# Patient Record
Sex: Male | Born: 1985 | Race: White | Hispanic: No | Marital: Married | Smoking: Former smoker
Health system: Southern US, Community
[De-identification: ages and names within clinical notes are randomized; demographics above are authoritative.]

## PROBLEM LIST (undated history)

## (undated) DIAGNOSIS — H209 Unspecified iridocyclitis: Secondary | ICD-10-CM

## (undated) DIAGNOSIS — G40A09 Absence epileptic syndrome, not intractable, without status epilepticus: Secondary | ICD-10-CM

## (undated) DIAGNOSIS — K219 Gastro-esophageal reflux disease without esophagitis: Secondary | ICD-10-CM

## (undated) DIAGNOSIS — T7840XA Allergy, unspecified, initial encounter: Secondary | ICD-10-CM

## (undated) HISTORY — DX: Absence epileptic syndrome, not intractable, without status epilepticus: G40.A09

## (undated) HISTORY — DX: Unspecified iridocyclitis: H20.9

## (undated) HISTORY — DX: Gastro-esophageal reflux disease without esophagitis: K21.9

## (undated) HISTORY — DX: Allergy, unspecified, initial encounter: T78.40XA

---

## 2013-01-23 ENCOUNTER — Encounter: Payer: Self-pay | Admitting: Internal Medicine

## 2013-01-23 ENCOUNTER — Ambulatory Visit: Payer: Self-pay | Admitting: Internal Medicine

## 2013-01-23 ENCOUNTER — Ambulatory Visit: Payer: BC Managed Care – PPO

## 2013-01-23 ENCOUNTER — Ambulatory Visit (INDEPENDENT_AMBULATORY_CARE_PROVIDER_SITE_OTHER)
Admission: RE | Admit: 2013-01-23 | Discharge: 2013-01-23 | Disposition: A | Discharge status: Home / Self Care | Payer: BC Managed Care – PPO | Source: Ambulatory Visit | Attending: Internal Medicine | Admitting: Internal Medicine

## 2013-01-23 ENCOUNTER — Ambulatory Visit (INDEPENDENT_AMBULATORY_CARE_PROVIDER_SITE_OTHER): Payer: BC Managed Care – PPO | Admitting: Internal Medicine

## 2013-01-23 VITALS — BP 120/74 | HR 61 | Temp 97.8°F | Resp 16 | Ht 75.5 in | Wt 208.0 lb

## 2013-01-23 DIAGNOSIS — R52 Pain, unspecified: Secondary | ICD-10-CM

## 2013-01-23 DIAGNOSIS — R109 Unspecified abdominal pain: Secondary | ICD-10-CM

## 2013-01-23 DIAGNOSIS — R3129 Other microscopic hematuria: Secondary | ICD-10-CM

## 2013-01-23 DIAGNOSIS — K219 Gastro-esophageal reflux disease without esophagitis: Secondary | ICD-10-CM

## 2013-01-23 LAB — COMPREHENSIVE METABOLIC PANEL
ALT: 17 U/L (ref 0–53)
Alkaline Phosphatase: 48 U/L (ref 39–117)
Glucose, Bld: 75 mg/dL (ref 70–99)
Sodium: 142 mEq/L (ref 135–145)
Total Bilirubin: 0.8 mg/dL (ref 0.3–1.2)
Total Protein: 7.2 g/dL (ref 6.0–8.3)

## 2013-01-23 LAB — URINALYSIS, ROUTINE W REFLEX MICROSCOPIC
Bilirubin Urine: NEGATIVE
Nitrite: NEGATIVE
Total Protein, Urine: NEGATIVE
pH: 6 (ref 5.0–8.0)

## 2013-01-23 LAB — CBC WITH DIFFERENTIAL/PLATELET
Basophils Absolute: 0 10*3/uL (ref 0.0–0.1)
HCT: 42 % (ref 39.0–52.0)
Lymphocytes Relative: 26 % (ref 12–46)
Monocytes Absolute: 0.7 10*3/uL (ref 0.1–1.0)
Neutro Abs: 4.2 10*3/uL (ref 1.7–7.7)
Neutrophils Relative %: 61 % (ref 43–77)
Platelets: 246 10*3/uL (ref 150–400)
RDW: 13.4 % (ref 11.5–15.5)
WBC: 6.9 10*3/uL (ref 4.0–10.5)

## 2013-01-23 LAB — LIPASE: Lipase: 35 U/L (ref 11.0–59.0)

## 2013-01-23 MED ORDER — ESOMEPRAZOLE MAGNESIUM 40 MG PO CPDR: 40.0000 mg | DELAYED_RELEASE_CAPSULE | Freq: Every day | ORAL | Status: DC

## 2013-01-23 NOTE — Progress Notes (Signed)
Subjective:    Patient ID: Jeffery Holt, male    DOB: 1986-08-16, 27 y.o.   MRN: [id number]  Flank Pain This is a new problem. The current episode started yesterday. The problem occurs intermittently. The problem is unchanged. Pain location: left mid flank. The quality of the pain is described as aching. The pain does not radiate. The pain is at a severity of 1/10. The pain is mild. Pertinent negatives include no abdominal pain, bladder incontinence, bowel incontinence, chest pain, dysuria, fever, headaches, leg pain, numbness, paresis, paresthesias, pelvic pain, perianal numbness, tingling, weakness or weight loss. He has tried NSAIDs for the symptoms. The treatment provided mild relief.  Gastrophageal Reflux He complains of heartburn. He reports no abdominal pain, no belching, no chest pain, no choking, no coughing, no dysphagia, no early satiety, no globus sensation, no hoarse voice, no nausea, no sore throat, no stridor, no tooth decay, no water brash or no wheezing. This is a recurrent problem. The problem has been unchanged. The heartburn is of mild intensity. The heartburn does not wake him from sleep. The heartburn does not limit his activity. The heartburn doesn't change with position. The symptoms are aggravated by ETOH. Pertinent negatives include no anemia, fatigue, melena, muscle weakness, orthopnea or weight loss. Risk factors include NSAIDs. He has tried an antacid (tums) for the symptoms. The treatment provided mild relief.      Review of Systems  Constitutional: Negative.  Negative for fever, chills, weight loss, diaphoresis, activity change, appetite change, fatigue and unexpected weight change.  HENT: Negative.  Negative for sore throat and hoarse voice.   Eyes: Negative.   Respiratory: Negative.  Negative for cough, choking and wheezing.   Cardiovascular: Negative.  Negative for chest pain.  Gastrointestinal: Positive for heartburn. Negative for dysphagia, nausea, vomiting,  abdominal pain, diarrhea, constipation, blood in stool, melena, abdominal distention, anal bleeding, rectal pain and bowel incontinence.  Endocrine: Negative.   Genitourinary: Positive for frequency and flank pain. Negative for bladder incontinence, dysuria, urgency, hematuria, decreased urine volume, discharge, scrotal swelling, enuresis, genital sores, penile pain, testicular pain and pelvic pain.  Musculoskeletal: Negative.  Negative for muscle weakness.  Skin: Negative.   Allergic/Immunologic: Negative.   Neurological: Negative for dizziness, tingling, weakness, numbness, headaches and paresthesias.  Hematological: Negative.  Negative for adenopathy. Does not bruise/bleed easily.  Psychiatric/Behavioral: Negative for suicidal ideas, hallucinations, behavioral problems, confusion, sleep disturbance, self-injury, dysphoric mood, decreased concentration and agitation. The patient is nervous/anxious. The patient is not hyperactive.        Objective:   Physical Exam  Vitals reviewed. Constitutional: He is oriented to person, place, and time. He appears well-developed and well-nourished.  Non-toxic appearance. He does not have a sickly appearance. He does not appear ill. No distress.  HENT:  Head: Normocephalic and atraumatic.  Mouth/Throat: Oropharynx is clear and moist. No oropharyngeal exudate.  Eyes: Conjunctivae are normal. Right eye exhibits no discharge. Left eye exhibits no discharge. No scleral icterus.  Neck: Normal range of motion. Neck supple. No JVD present. No tracheal deviation present. No thyromegaly present.  Cardiovascular: Normal rate, regular rhythm, normal heart sounds and intact distal pulses.  Exam reveals no gallop and no friction rub.   No murmur heard. Pulmonary/Chest: Effort normal and breath sounds normal. No accessory muscle usage or stridor. Not tachypneic. No respiratory distress. He has no decreased breath sounds. He has no wheezes. He has no rhonchi. He has no  rales. He exhibits no mass, no tenderness, no bony tenderness, no  crepitus, no edema, no deformity and no swelling.  Abdominal: Soft. Normal appearance and bowel sounds are normal. He exhibits no mass. There is no hepatosplenomegaly, splenomegaly or hepatomegaly. There is no tenderness. There is no rigidity, no rebound, no guarding, no CVA tenderness, no tenderness at McBurney's point and negative Murphy's sign. No hernia. Hernia confirmed negative in the ventral area, confirmed negative in the right inguinal area and confirmed negative in the left inguinal area.  Genitourinary: Rectum normal, prostate normal, testes normal and penis normal. Rectal exam shows no external hemorrhoid, no internal hemorrhoid, no fissure, no mass, no tenderness and anal tone normal. Guaiac negative stool. Prostate is not enlarged and not tender. Right testis shows no swelling and no tenderness. Right testis is descended. Left testis shows no mass, no swelling and no tenderness. Left testis is descended. Circumcised. No penile erythema or penile tenderness. No discharge found.  Musculoskeletal: He exhibits no edema and no tenderness.  Lymphadenopathy:    He has no cervical adenopathy.       Right: No inguinal adenopathy present.       Left: No inguinal adenopathy present.  Neurological: He is oriented to person, place, and time.  Skin: Skin is warm and dry. No rash noted. He is not diaphoretic. No erythema. No pallor.  Psychiatric: His behavior is normal. Judgment and thought content normal. His mood appears anxious. His affect is not angry, not blunt, not labile and not inappropriate. His speech is not rapid and/or pressured. Thought content is not paranoid. Cognition and memory are normal. He does not exhibit a depressed mood.     No results found for this basename: WBC, HGB, HCT, PLT, GLUCOSE, CHOL, TRIG, HDL, LDLDIRECT, LDLCALC, ALT, AST, NA, K, CL, CREATININE, BUN, CO2, TSH, PSA, INR, GLUF, HGBA1C, MICROALBUR        Assessment & Plan:

## 2013-01-23 NOTE — Assessment & Plan Note (Signed)
He agrees to taper and stop his EtOH intake Will start nexium to treat the GERD and prophylax for PUD

## 2013-01-23 NOTE — Patient Instructions (Signed)

## 2013-01-23 NOTE — Assessment & Plan Note (Signed)
I will check his plain film to look for stones, free air, ileus, SBO Will also check his labs to look for for organic causes as well

## 2013-01-24 ENCOUNTER — Encounter: Payer: Self-pay | Admitting: Internal Medicine

## 2013-01-24 DIAGNOSIS — R3129 Other microscopic hematuria: Secondary | ICD-10-CM | POA: Insufficient documentation

## 2013-01-24 NOTE — Addendum Note (Signed)
Addended by: [name] on: 01/24/2013 11:42 AM   Modules accepted: Orders

## 2013-01-28 ENCOUNTER — Other Ambulatory Visit: Payer: BC Managed Care – PPO

## 2013-01-28 ENCOUNTER — Telehealth: Payer: Self-pay

## 2013-01-28 ENCOUNTER — Telehealth: Payer: Self-pay | Admitting: Internal Medicine

## 2013-01-28 NOTE — Telephone Encounter (Signed)
Pt transferred by [facility] regarding CT orders. Pt states that "he has not met his insurance deductible and does not wish to proceed with CT if not really necessary". Per pt he was advised that xray did not show any kidney stones. He has been drinking lots of water and has started to feel better. Pt will continue to drink water and see if he continues to feel better. If not he will call back and update symptoms to see what else may need to be done before having the CT scan done.

## 2013-01-28 NOTE — Telephone Encounter (Signed)
Pt was scheduled for 01/28/13 @ 2pm [facility] Dept. appt was cancelled.

## 2013-01-28 NOTE — Telephone Encounter (Signed)
Patient Information:  Caller Name: Jeffery  Phone: [phone]  Patient: Jeffery Holt, Jeffery Holt  Gender: Male  DOB: 1985-12-31  Age: 27 Years  PCP: Sanda Linger (Adults only)  Office Follow Up:  Does the office need to follow up with this patient?: No  Instructions For The Office: Holt/A  RN Note:  Pt has an appt scheduled 01/29/13, @ 9am w/ Nicki Reaper.  Symptoms  Reason For Call & Symptoms: Transient tingling in hands and feet.  No swelling or discoloration.   Pt felt lightheaded on 01/27/13.  No sx at time of the call.    Left side flank pain has resolved.   Urinating w/o discomfort.  Urine is clear.  Pt states he felt some pressure when he urinated.   Caller very anxious. Pt triaged negative per Neuro and Dizziness protocol.  Reviewed Health History In EMR: Yes  Reviewed Medications In EMR: Yes  Reviewed Allergies In EMR: Yes  Reviewed Surgeries / Procedures: Yes  Date of Onset of Symptoms: 01/27/2013  Treatments Tried: Nexium...pt stopped taking  Treatments Tried Worked: No  Guideline(s) Used:  Neurologic Deficit  Dizziness  No Protocol Available - Sick Adult  Disposition Per Guideline:   See Within 3 Days in Office  Reason For Disposition Reached:   Nursing judgment  Advice Given:  Call Back If:  Still feel dizzy after 2 hours of rest and fluids  Passes out (faints)  You become worse.  Rest for 1-2 Hours:  Lie down with feet elevated for 1 hour. This will improve blood flow and increase blood flow to the brain.  Call Back If:  New symptoms develop  You become worse.  Patient Will Follow Care Advice:  YES

## 2013-01-29 ENCOUNTER — Ambulatory Visit (INDEPENDENT_AMBULATORY_CARE_PROVIDER_SITE_OTHER): Payer: BC Managed Care – PPO | Admitting: Internal Medicine

## 2013-01-29 ENCOUNTER — Encounter: Payer: Self-pay | Admitting: Internal Medicine

## 2013-01-29 ENCOUNTER — Other Ambulatory Visit (INDEPENDENT_AMBULATORY_CARE_PROVIDER_SITE_OTHER): Payer: BC Managed Care – PPO

## 2013-01-29 VITALS — BP 122/80 | HR 68 | Temp 97.4°F | Ht 75.5 in | Wt 206.0 lb

## 2013-01-29 DIAGNOSIS — R109 Unspecified abdominal pain: Secondary | ICD-10-CM

## 2013-01-29 DIAGNOSIS — IMO0002 Reserved for concepts with insufficient information to code with codable children: Secondary | ICD-10-CM

## 2013-01-29 DIAGNOSIS — R52 Pain, unspecified: Secondary | ICD-10-CM

## 2013-01-29 DIAGNOSIS — R209 Unspecified disturbances of skin sensation: Secondary | ICD-10-CM

## 2013-01-29 DIAGNOSIS — F10230 Alcohol dependence with withdrawal, uncomplicated: Secondary | ICD-10-CM

## 2013-01-29 DIAGNOSIS — F10239 Alcohol dependence with withdrawal, unspecified: Secondary | ICD-10-CM

## 2013-01-29 LAB — URINALYSIS, ROUTINE W REFLEX MICROSCOPIC
Leukocytes, UA: NEGATIVE
RBC / HPF: NONE SEEN (ref 0–?)
Specific Gravity, Urine: <=1.005 (ref 1.000–1.030)
Urobilinogen, UA: 0.2 (ref 0.0–1.0)
WBC, UA: NONE SEEN (ref 0–?)

## 2013-01-29 NOTE — Patient Instructions (Addendum)

## 2013-01-29 NOTE — Progress Notes (Signed)
Subjective:    Patient ID: Jeffery Holt, male    DOB: 1986-07-14, 27 y.o.   MRN: [id number]  HPI  Pt presents to the clinic today with c/o left flank pain. He saw Dr. Yetta Barre 1 week ago for the same. Urinalysis did show trace blood and ketones. Plain film of the abd was negative. CT was ordered but pt did not have the money to have the CT scan done. He denies urinary symptoms. He is not sure why he has this dull ache in his left flank. He does have a family history of kidney stones. He is also experiencing numbness and tingling in his hands and feet. This does come and go. It did start after he abruptly stopped drinking alcohol. He was drinking about 4-5 beers per night. He does feel really anxious about everything that is going on. He feels like he has been relatively healthy and all of a sudden, he seems to be having all these health issue.   Review of Systems      Past Medical History  Diagnosis Date  . Allergy   . GERD (gastroesophageal reflux disease)     Current Outpatient Prescriptions  Medication Sig Dispense Refill  . esomeprazole (NEXIUM) 40 MG capsule Take 1 capsule (40 mg total) by mouth daily.  30 capsule  0   No current facility-administered medications for this visit.    No Known Allergies  Family History  Problem Relation Age of Onset  . Alcohol abuse Neg Hx   . Cancer Neg Hx   . COPD Neg Hx   . Depression Neg Hx   . Diabetes Neg Hx   . Drug abuse Neg Hx   . Early death Neg Hx   . Hearing loss Neg Hx   . Heart disease Neg Hx   . Hyperlipidemia Neg Hx   . Hypertension Neg Hx   . Kidney disease Neg Hx   . Stroke Neg Hx   . Kidney Stones Father     History   Social History  . Marital Status: Married    Spouse Name: N/A    Number of Children: N/A  . Years of Education: N/A   Occupational History  . Not on file.   Social History Main Topics  . Smoking status: Former Smoker    Quit date: 01/23/2006  . Smokeless tobacco: Never Used  . Alcohol Use: 12.0  oz/week    20 Shots of liquor per week  . Drug Use: No  . Sexually Active: Yes   Other Topics Concern  . Not on file   Social History Narrative  . No narrative on file     Constitutional: Denies fever, malaise, fatigue, headache or abrupt weight changes.  GU: Pt reports left flank pain. Denies urgency, frequency, pain with urination, burning sensation, blood in urine, odor or discharge. Musculoskeletal: Denies decrease in range of motion, difficulty with gait, muscle pain or joint pain and swelling.  Skin: Denies redness, rashes, lesions or ulcercations.  Neurological: Pt reports numbness and tingling in the hands and feet. Denies dizziness, difficulty with memory, difficulty with speech or problems with balance and coordination.   No other specific complaints in a complete review of systems (except as listed in HPI above).  Objective:   Physical Exam   BP 122/80  Pulse 68  Temp(Src) 97.4 F (36.3 C) (Oral)  Ht 6' 3.5" (1.918 m)  Wt 206 lb (93.441 kg)  BMI 25.4 kg/m2  SpO2 98% Wt Readings from Last  3 Encounters:  01/29/13 206 lb (93.441 kg)  01/23/13 208 lb (94.348 kg)    General: Appears his stated age, very anxious but well developed, well nourished in NAD. Skin: Warm, dry and intact. No rashes, lesions or ulcerations noted. Cardiovascular: Normal rate and rhythm. S1,S2 noted.  No murmur, rubs or gallops noted. No JVD or BLE edema. No carotid bruits noted. Pulmonary/Chest: Normal effort and positive vesicular breath sounds. No respiratory distress. No wheezes, rales or ronchi noted.  Abdomen: Soft and nontender. Normal bowel sounds, no bruits noted. No distention or masses noted. Liver, spleen and kidneys non palpable. No CVA tenderness. Musculoskeletal: Normal range of motion. No signs of joint swelling. No difficulty with gait.  Neurological: Alert and oriented. Cranial nerves II-XII intact. Coordination normal. +DTRs bilaterally.   BMET    Component Value  Date/Time   NA 142 01/23/2013 1605   K 4.0 01/23/2013 1605   CL 104 01/23/2013 1605   CO2 31 01/23/2013 1605   GLUCOSE 75 01/23/2013 1605   BUN 16 01/23/2013 1605   CREATININE 1.2 01/23/2013 1605   CALCIUM 9.4 01/23/2013 1605     CBC    Component Value Date/Time   WBC 6.9 01/23/2013 1652   RBC 4.70 01/23/2013 1652   HGB 15.5 01/23/2013 1652   HCT 42.0 01/23/2013 1652   PLT 246 01/23/2013 1652   MCV 89.4 01/23/2013 1652   MCH 33.0 01/23/2013 1652   MCHC 36.9* 01/23/2013 1652   RDW 13.4 01/23/2013 1652   LYMPHSABS 1.8 01/23/2013 1652   MONOABS 0.7 01/23/2013 1652   EOSABS 0.2 01/23/2013 1652   BASOSABS 0.0 01/23/2013 1652         Assessment & Plan:   Left flank pain, recurrent:  Will check urinalysis again If normal, recommend proceeding with the CT scan as previously ordered What for urinary symptoms, increase in pain or blood in your urine  Paraesthesia of bilateral hands and feet, likely due to abrupt discontinuation of alcohol:  Monitor your symptoms Reassurance given that this is likely transient  RTC as needed or if symptoms persist or worsen

## 2013-03-25 ENCOUNTER — Ambulatory Visit: Payer: Self-pay | Admitting: Internal Medicine

## 2013-03-25 DIAGNOSIS — Z0289 Encounter for other administrative examinations: Secondary | ICD-10-CM

## 2013-07-09 ENCOUNTER — Other Ambulatory Visit: Payer: Self-pay

## 2013-07-13 ENCOUNTER — Ambulatory Visit (INDEPENDENT_AMBULATORY_CARE_PROVIDER_SITE_OTHER): Payer: BC Managed Care – PPO | Admitting: Nurse Practitioner

## 2013-07-13 VITALS — BP 128/82 | HR 48 | Temp 98.3°F | Wt 201.0 lb

## 2013-07-13 DIAGNOSIS — M419 Scoliosis, unspecified: Secondary | ICD-10-CM

## 2013-07-13 DIAGNOSIS — R0781 Pleurodynia: Secondary | ICD-10-CM

## 2013-07-13 DIAGNOSIS — R079 Chest pain, unspecified: Secondary | ICD-10-CM

## 2013-07-13 DIAGNOSIS — M4120 Other idiopathic scoliosis, site unspecified: Secondary | ICD-10-CM

## 2013-07-13 NOTE — Patient Instructions (Addendum)
I do not think the L sided abdominal swelling is related to the flank pain you are having. The swelling seems to be muscle inflammation. You may have a kidney stone forming causing flank pain. You had calcium oxylate crystals in your urine several months ago, which is the most common kindey stone precipitate. Stop drinking tea. Stay hydrated. You need at least 4 16 oz water daily & 6 16 oz on days that you are working out & sweating.   You have slight scoliosis which may be contributing to muscular inflammation. Please see orthopedist for further work up & management. In the meantime, 400-600 mg ibuprophen every 8 hours to decrease inflammation & discomfort.  Please let us know if flak pain gets worse or is not resolving, or you develop new symptoms.  Kidney Stones Kidney stones (urolithiasis) are deposits that form inside your kidneys. The intense pain is caused by the stone moving through the urinary tract. When the stone moves, the ureter goes into spasm around the stone. The stone is usually passed in the urine.  CAUSES   A disorder that makes certain neck glands produce too much parathyroid hormone (primary hyperparathyroidism).  A buildup of uric acid crystals, similar to gout in your joints.  Narrowing (stricture) of the ureter.  A kidney obstruction present at birth (congenital obstruction).  Previous surgery on the kidney or ureters.  Numerous kidney infections. SYMPTOMS   Feeling sick to your stomach (nauseous).  Throwing up (vomiting).  Blood in the urine (hematuria).  Pain that usually spreads (radiates) to the groin.  Frequency or urgency of urination. DIAGNOSIS   Taking a history and physical exam.  Blood or urine tests.  CT scan.  Occasionally, an examination of the inside of the urinary bladder (cystoscopy) is performed. TREATMENT   Observation.  Increasing your fluid intake.  Extracorporeal shock wave lithotripsy This is a noninvasive procedure that  uses shock waves to break up kidney stones.  Surgery may be needed if you have severe pain or persistent obstruction. There are various surgical procedures. Most of the procedures are performed with the use of small instruments. Only small incisions are needed to accommodate these instruments, so recovery time is minimized. The size, location, and chemical composition are all important variables that will determine the proper choice of action for you. Talk to your health care provider to better understand your situation so that you will minimize the risk of injury to yourself and your kidney.  HOME CARE INSTRUCTIONS   Drink enough water and fluids to keep your urine clear or pale yellow. This will help you to pass the stone or stone fragments.  Strain all urine through the provided strainer. Keep all particulate matter and stones for your health care provider to see. The stone causing the pain may be as small as a grain of salt. It is very important to use the strainer each and every time you pass your urine. The collection of your stone will allow your health care provider to analyze it and verify that a stone has actually passed. The stone analysis will often identify what you can do to reduce the incidence of recurrences.  Only take over-the-counter or prescription medicines for pain, discomfort, or fever as directed by your health care provider.  Make a follow-up appointment with your health care provider as directed.  Get follow-up X-rays if required. The absence of pain does not always mean that the stone has passed. It may have only stopped moving. If the  urine remains completely obstructed, it can cause loss of kidney function or even complete destruction of the kidney. It is your responsibility to make sure X-rays and follow-ups are completed. Ultrasounds of the kidney can show blockages and the status of the kidney. Ultrasounds are not associated with any radiation and can be performed easily  in a matter of minutes. SEEK MEDICAL CARE IF:  You experience pain that is progressive and unresponsive to any pain medicine you have been prescribed. SEEK IMMEDIATE MEDICAL CARE IF:   Pain cannot be controlled with the prescribed medicine.  You have a fever or shaking chills.  The severity or intensity of pain increases over 18 hours and is not relieved by pain medicine.  You develop a new onset of abdominal pain.  You feel faint or pass out.  You are unable to urinate. MAKE SURE YOU:   Understand these instructions.  Will watch your condition.  Will get help right away if you are not doing well or get worse. Document Released: 08/20/2005 Document Revised: 04/22/2013 Document Reviewed: 01/21/2013 [facility] Patient Information 2014 [place], [place].

## 2013-07-13 NOTE — Progress Notes (Signed)
Subjective:     Jeffery Holt is a 27 y.o. male and is here for evaluation of LUQ swelling & discomfort. He was seen 5 mos ago for same pain, but without swelling. He had calcium oxylate crystals and trace blood in urine. Acute abd series was showed T12-l1 scoliosis o/w unremarkable. abd ct was ordered, but pt refused due to cost. He was started on omeprazole for reflux, which he took occasionally for a few days with no relief in symptoms. Discomfort resolved after few weeks. Now has recurred & pt noticed that he has a swollen area under ribs LUQ. He has been working out vigorously & has lost another 7 pounds. He wonders if the swollen area was present several mos ago, but now visible due to weight loss. He denies change in bowel pattern, diarrhea, nausea, heart burn. He c/o occasional back mild pain. No visible hematuria.  History   Social History  . Marital Status: Married    Spouse Name: N/A    Number of Children: N/A  . Years of Education: N/A   Occupational History  . Not on file.   Social History Main Topics  . Smoking status: Former Smoker    Quit date: 01/23/2006  . Smokeless tobacco: Never Used  . Alcohol Use: 12.0 oz/week    20 Shots of liquor per week  . Drug Use: No  . Sexual Activity: Yes   Other Topics Concern  . Not on file   Social History Narrative  . No narrative on file   Health Maintenance  Topic Date Due  . Influenza Vaccine  04/03/2013  . Tetanus/tdap  01/23/2014    The following portions of the patient's history were reviewed and updated as appropriate: allergies, current medications, past family history, past medical history and problem list.  Review of Systems Constitutional: positive for weight loss, negative for fevers and night sweats Ears, nose, mouth, throat, and face: negative for feeling of something "stuck" in throat Respiratory: negative for cough, dyspnea on exertion, sputum and wheezing Cardiovascular: negative for chest pain, chest  pressure/discomfort, lower extremity edema and near-syncope Gastrointestinal: negative for change in bowel habits, constipation, diarrhea, dyspepsia, nausea, reflux symptoms and vomiting Genitourinary:negative for decreased stream and hematuria Musculoskeletal:positive for occasional low back pain several mos ago, swollen area below ribs that feels "thick"   Objective:    BP 128/82  Pulse 48  Temp(Src) 98.3 F (36.8 C) (Oral)  Wt 201 lb (91.173 kg)  SpO2 97% General appearance: alert, cooperative, appears stated age and no distress Head: Normocephalic, without obvious abnormality, atraumatic Eyes: negative findings: lids and lashes normal and conjunctivae and sclerae normal Back: mild dextroscoliosis- L lumbar area higher than R ; R scapula higher than L Lungs: clear to auscultation bilaterally Chest wall: no tenderness, swollen area at base of L ribs-cannot reproduce pain, but c/o mild discomfort when I palpate abdomen on LUQ Heart: regular rate and rhythm, S1, S2 normal, no murmur, click, rub or gallop Abdomen: soft, non-tender; bowel sounds normal; no masses,  no organomegaly and distal rib edge feels thicker on L than R. pt c/o of pressure sensation to L sided palpation.    MSK: L sided CVA discomfort Assessment:   CVA tenderness & c/o LUQ tenderness Hx of hematuria & calcium oxylate crystals. DD: kidney stone, reflux, myopathy, rib fracture Distal L-sided rib swelling Mild dextro scoliosis   Plan:    1 Likely he has kidney stone formation. Hydrate. Stop drinking tea & foods with oxylate. 2 likely  muscle strain r/t strenuous work-outs. Try ibuprophen to help r/i, r/o if this is inflammatory pain. Sports med referral. 3 sports med referral for eval & Tx See After Visit Summary for Counseling Recommendations

## 2013-07-13 NOTE — Progress Notes (Signed)
Pre-visit discussion using our clinic review tool. No additional management support is needed unless otherwise documented below in the visit note.  

## 2013-07-14 ENCOUNTER — Ambulatory Visit (INDEPENDENT_AMBULATORY_CARE_PROVIDER_SITE_OTHER): Payer: BC Managed Care – PPO | Admitting: Family Medicine

## 2013-07-14 ENCOUNTER — Encounter: Payer: Self-pay | Admitting: Family Medicine

## 2013-07-14 VITALS — BP 120/80 | HR 56 | Wt 202.0 lb

## 2013-07-14 DIAGNOSIS — R1012 Left upper quadrant pain: Secondary | ICD-10-CM

## 2013-07-14 NOTE — Assessment & Plan Note (Signed)
Mood patient does have what appears to be a muscle strain/swelling. I do not appreciate any cyst formation or any mass formation. I think patient did injure himself with heavy lifting. We discussed changing his lifting routine to avoid upper abdominal exercises for now. Discussed icing protocol and ibuprofen. Patient will decrease the amount of weight he is lifting as well. He'll return in 2 weeks. As long as he is doing better we can start to transition him to more activity.

## 2013-07-14 NOTE — Patient Instructions (Signed)
Abdominal muscle strain we saw today Ibuprofen 600mg  three times a day.  Ice 20 minutes 2 times a day.  Decrease abdominal exercises, can still do lower abs and obliques if don't hurt Avoid swimming more than 1 time a week.  Lifting decraease to 60% of what you are doing and increase by 10% daily thereafter.  Come back again in 2-3 weeks.

## 2013-07-14 NOTE — Progress Notes (Signed)
  I'm seeing this patient by the request  of:  Sanda Linger, MD And NP weaver.   CC: Patient is a pleasant 27 year old gentleman who is actually coming in with full left abdominal pain  HPI: Patient states he has had some left back pain as well as left abdominal pain. Patient feels that the back pain seems to be well-controlled patient states though unfortunately the upper left quadrant abdominal pain seems to be continuing to give him trouble. Patient has been diagnosed with potentially kidney stones as well as gastroesophageal reflux disease. Patient states the medications and as well as hydration hasn't seemed to help this. Patient notices pain over the course of multiple months. Patient has been weightlifting and has gained approximately 15 pounds of muscle recently. Patient states the left side also seems to be swollen compared to his right side. This is over his abdominal muscles. Patient states he notices it more when he standing up visually. Patient states that it seems to be a dull aching pain and can hurt more with certain movements and activities. Patient denies any nighttime awakening, denies any radiation, denies any association with food or urination. Patient also denies any recent illnesses, fevers or chills or any weight loss. Patient states that this pain is not debilitating but would like to know what it is. Severity is 6/10  Past medical, surgical, family and social history reviewed. Medications reviewed all in the electronic medical record.   Review of Systems: No headache, visual changes, nausea, vomiting, diarrhea, constipation, dizziness, abdominal pain, skin rash, fevers, chills, night sweats, weight loss, swollen lymph nodes, body aches, joint swelling, muscle aches, chest pain, shortness of breath, mood changes.   Objective:    Blood pressure 120/80, pulse 56, weight 202 lb (91.627 kg), SpO2 97.00%.   General: No apparent distress alert and oriented x3 mood and affect normal,  dressed appropriately.  HEENT: Pupils equal, extraocular movements intact Respiratory: Patient's speak in full sentences and does not appear short of breath Cardiovascular: No lower extremity edema, non tender, no erythema Skin: Warm dry intact with no signs of infection or rash on extremities or on axial skeleton. Abdomen: On inspection patient does have increased definition it appears on the left superior rectus abdominis compared to the contralateral side. This is minimally tender to palpation. On abdominal exam there is no hepatosplenomegaly, no masses appreciated. No rebound tenderness and no involuntary guarding. Back exam shows no CVA tenderness. Neuro: Cranial nerves II through XII are intact, neurovascularly intact in all extremities with 2+ DTRs and 2+ pulses. Lymph: No lymphadenopathy of posterior or anterior cervical chain or axillae bilaterally.  Gait normal with good balance and coordination.  MSK: Non tender with full range of motion and good stability and symmetric strength and tone of shoulders, elbows, wrist, hip, knee and ankles bilaterally.   Muscular skeletal ultrasound was performed and interpreted by Antoine Primas, M Limited ultrasound of the abdominal rectus muscle shows the patient does have what appears to be mild myositis in the muscle tear that is appreciated. With Valsalva there was no findings of a hernia.  Impression: Abdominal muscle tear with surrounding hypoechoic changes.  Impression and Recommendations:     This case required medical decision making of moderate complexity.

## 2013-08-04 ENCOUNTER — Encounter: Payer: Self-pay | Admitting: Family Medicine

## 2013-08-04 ENCOUNTER — Ambulatory Visit (INDEPENDENT_AMBULATORY_CARE_PROVIDER_SITE_OTHER): Payer: BC Managed Care – PPO | Admitting: Family Medicine

## 2013-08-04 VITALS — BP 132/80 | HR 53 | Wt 200.0 lb

## 2013-08-04 DIAGNOSIS — R1012 Left upper quadrant pain: Secondary | ICD-10-CM

## 2013-08-04 NOTE — Patient Instructions (Addendum)
[employer] multivitamin Daily.  2 cups of water before each meal.  Start lifting again.  30% of what you were doing weight wise.   Increase every 5 days 10%. Focus only on core though 3 times a week.  Come back again if headaches  Continue past another 3 weeks come back and we will get labs.  Take tylenol 650 mg three times a day is the best evidence based medicine we have for arthritis.  Glucosamine sulfate 750mg  twice a day is a supplement that has been shown to help moderate to severe arthritis. Vitamin D 1000 IU daily Fish oil 2 grams daily.  Tumeric 500mg  twice daily.  Capsaicin topically up to four times a day may also help with pain.

## 2013-08-04 NOTE — Progress Notes (Signed)
Pre-visit discussion using our clinic review tool. No additional management support is needed unless otherwise documented below in the visit note.  

## 2013-08-04 NOTE — Progress Notes (Signed)
  CC: Followup abdominal muscle tear  HPI: Patient is here for followup of his left upper quadrant a tunnel muscle tear. Patient states that he is doing significantly better. Patient has not been weight lifting since previous visit. Patient denies any radiation of pain, denies any numbness or tingling, denies any bowel bladder or problems. Patient does state that he has new onset headaches that are seen to be intermittent mid day but goes away without any medications or any other changes. He does not know if this is relevant. Overall he is making improvement he states.  Past medical, surgical, family and social history reviewed. Medications reviewed all in the electronic medical record.   Review of Systems: No headache, visual changes, nausea, vomiting, diarrhea, constipation, dizziness, abdominal pain, skin rash, fevers, chills, night sweats, weight loss, swollen lymph nodes, body aches, joint swelling, muscle aches, chest pain, shortness of breath, mood changes.   Objective:    Blood pressure 132/80, pulse 53, weight 200 lb (90.719 kg), SpO2 97.00%.   General: No apparent distress alert and oriented x3 mood and affect normal, dressed appropriately.  HEENT: Pupils equal, extraocular movements intact Respiratory: Patient's speak in full sentences and does not appear short of breath Cardiovascular: No lower extremity edema, non tender, no erythema Skin: Warm dry intact with no signs of infection or rash on extremities or on axial skeleton. Abdomen: On inspection patient does have increased definition it appears on the left superior rectus abdominis compared to the contralateral side. This is minimally tender to palpation. On abdominal exam there is no hepatosplenomegaly, no masses appreciated. No rebound tenderness and no involuntary guarding. Back exam shows no CVA tenderness. Neuro: Cranial nerves II through XII are intact, neurovascularly intact in all extremities with 2+ DTRs and 2+  pulses. Lymph: No lymphadenopathy of posterior or anterior cervical chain or axillae bilaterally.  Gait normal with good balance and coordination.  MSK: Non tender with full range of motion and good stability and symmetric strength and tone of shoulders, elbows, wrist, hip, knee and ankles bilaterally.   Muscular skeletal ultrasound was performed and interpreted by Antoine Primas, M Limited ultrasound of the abdominal rectus muscle shows the patient does show patient has caused healing with some good scar tissue formation. Impression: Routine healing from muscle tear  Impression and Recommendations:     This case required medical decision making of moderate complexity.

## 2013-08-04 NOTE — Assessment & Plan Note (Signed)
Patient seems to be improving very well at this time. We will get him back to lifting but will start a 30% did have a progression is stated in patient instructions. Discussed icing and other protocols. Discussed any multivitamin to see if this will help his headaches. Patient will come back in 2-3 weeks if pain returns with increasing activity orthotics do not resolve. At that time I would get complete metabolic panel, complete blood count, iron levels. I would also consider thyroid and testosterone levels

## 2013-08-10 ENCOUNTER — Ambulatory Visit (INDEPENDENT_AMBULATORY_CARE_PROVIDER_SITE_OTHER): Payer: BC Managed Care – PPO | Admitting: Internal Medicine

## 2013-08-10 VITALS — BP 128/78 | HR 64 | Temp 98.2°F | Resp 16 | Ht 75.5 in | Wt 205.0 lb

## 2013-08-10 DIAGNOSIS — R519 Headache, unspecified: Secondary | ICD-10-CM | POA: Insufficient documentation

## 2013-08-10 DIAGNOSIS — R51 Headache: Secondary | ICD-10-CM

## 2013-08-10 MED ORDER — BUTALBITAL-ACETAMINOPHEN 50-300 MG PO TABS: 1.0000 | ORAL_TABLET | Freq: Two times a day (BID) | ORAL | Status: DC | PRN

## 2013-08-10 NOTE — Patient Instructions (Signed)

## 2013-08-10 NOTE — Progress Notes (Signed)
Subjective:    Patient ID: Jeffery Holt, male    DOB: 15-Jun-1986, 27 y.o.   MRN: [id number]  Headache  This is a new problem. The current episode started 1 to 4 weeks ago. The problem occurs intermittently. The problem has been unchanged. The pain is located in the right unilateral and temporal region. The pain does not radiate. The pain quality is not similar to prior headaches. The quality of the pain is described as squeezing. The pain is at a severity of 2/10. The pain is mild. Associated symptoms include muscle aches (right eye twitches) and numbness (he has had episodes of numbness and tingling in his LLE). Pertinent negatives include no abdominal pain, abnormal behavior, anorexia, back pain, blurred vision, coughing, dizziness, drainage, ear pain, eye pain, eye redness, eye watering, facial sweating, fever, hearing loss, insomnia, loss of balance, nausea, neck pain, phonophobia, photophobia, rhinorrhea, scalp tenderness, seizures, sinus pressure, sore throat, swollen glands, tingling, tinnitus, visual change, vomiting, weakness or weight loss. Nothing aggravates the symptoms. He has tried nothing for the symptoms. The treatment provided no relief. There is no history of cancer, cluster headaches, hypertension, immunosuppression, migraine headaches, migraines in the family, obesity, pseudotumor cerebri, recent head traumas, sinus disease or TMJ.      Review of Systems  Constitutional: Negative.  Negative for fever, chills, weight loss, diaphoresis, appetite change and fatigue.  HENT: Negative for ear pain, hearing loss, rhinorrhea, sinus pressure, sore throat, tinnitus, trouble swallowing and voice change.   Eyes: Negative.  Negative for blurred vision, photophobia, pain and redness.  Respiratory: Negative.  Negative for cough.   Cardiovascular: Negative.  Negative for chest pain, palpitations and leg swelling.  Gastrointestinal: Negative.  Negative for nausea, vomiting, abdominal pain,  diarrhea, constipation and anorexia.  Endocrine: Negative.   Genitourinary: Negative.   Musculoskeletal: Negative.  Negative for back pain and neck pain.  Skin: Negative.   Allergic/Immunologic: Negative.   Neurological: Positive for numbness (he has had episodes of numbness and tingling in his LLE) and headaches. Negative for dizziness, tingling, tremors, seizures, syncope, facial asymmetry, speech difficulty, weakness, light-headedness and loss of balance.  Hematological: Negative.  Negative for adenopathy. Does not bruise/bleed easily.  Psychiatric/Behavioral: Negative.  The patient does not have insomnia.        Objective:   Physical Exam  Vitals reviewed. Constitutional: He is oriented to person, place, and time. He appears well-developed and well-nourished.  Non-toxic appearance. He does not have a sickly appearance. He does not appear ill. No distress.  HENT:  Head: Normocephalic and atraumatic.  Mouth/Throat: Oropharynx is clear and moist. No oropharyngeal exudate.  Eyes: Conjunctivae and EOM are normal. Pupils are equal, round, and reactive to light. Right eye exhibits no discharge. Left eye exhibits no discharge. No scleral icterus.  Neck: Normal range of motion. Neck supple. No JVD present. No tracheal deviation present. No thyromegaly present.  Cardiovascular: Normal rate, regular rhythm, normal heart sounds and intact distal pulses.  Exam reveals no gallop and no friction rub.   No murmur heard. Pulmonary/Chest: Effort normal and breath sounds normal. No stridor. No respiratory distress. He has no wheezes. He has no rales. He exhibits no tenderness.  Abdominal: Soft. Bowel sounds are normal. He exhibits no distension and no mass. There is no tenderness. There is no rebound and no guarding.  Musculoskeletal: Normal range of motion. He exhibits no edema and no tenderness.  Lymphadenopathy:    He has no cervical adenopathy.  Neurological: He is alert and  oriented to person,  place, and time. He has normal strength. He displays no atrophy, no tremor and normal reflexes. No cranial nerve deficit or sensory deficit. He exhibits normal muscle tone. He displays a negative Romberg sign. He displays no seizure activity. Coordination and gait normal. He displays no Babinski's sign on the right side. He displays no Babinski's sign on the left side.  Reflex Scores:      Tricep reflexes are 1+ on the right side and 1+ on the left side.      Bicep reflexes are 1+ on the right side and 1+ on the left side.      Brachioradialis reflexes are 1+ on the right side and 1+ on the left side.      Patellar reflexes are 1+ on the right side and 1+ on the left side.      Achilles reflexes are 1+ on the right side and 1+ on the left side. Skin: Skin is warm and dry. No rash noted. He is not diaphoretic. No erythema. No pallor.  Psychiatric: He has a normal mood and affect. His behavior is normal. Judgment and thought content normal.     Lab Results  Component Value Date   WBC 6.9 01/23/2013   HGB 15.5 01/23/2013   HCT 42.0 01/23/2013   PLT 246 01/23/2013   GLUCOSE 75 01/23/2013   ALT 17 01/23/2013   AST 20 01/23/2013   NA 142 01/23/2013   K 4.0 01/23/2013   CL 104 01/23/2013   CREATININE 1.2 01/23/2013   BUN 16 01/23/2013   CO2 31 01/23/2013       Assessment & Plan:

## 2013-08-11 ENCOUNTER — Encounter: Payer: Self-pay | Admitting: Internal Medicine

## 2013-08-11 NOTE — Assessment & Plan Note (Signed)
I think he has tension-type headaches so I have given him samples of Bupap to take for the pain but he also have some unusual associated neuro symptoms so I have asked him to have an MRI of his brain done to look for mass, demyelination, etc.

## 2014-07-09 ENCOUNTER — Ambulatory Visit (INDEPENDENT_AMBULATORY_CARE_PROVIDER_SITE_OTHER): Payer: BC Managed Care – PPO | Admitting: Internal Medicine

## 2014-07-09 ENCOUNTER — Encounter: Payer: Self-pay | Admitting: Internal Medicine

## 2014-07-09 VITALS — BP 106/66 | HR 56 | Temp 97.9°F | Resp 20 | Ht 75.0 in | Wt 206.1 lb

## 2014-07-09 DIAGNOSIS — J302 Other seasonal allergic rhinitis: Secondary | ICD-10-CM

## 2014-07-09 NOTE — Patient Instructions (Signed)
I would recommend ibuprofen or the for your muscle aches. You can try DayQuil or NyQuil for your cold like symptoms. If you are not feeling any better or starts to feel worse by about Monday or Tuesday please call us back.  Be sure to drink plenty of fluids to stay hydrated while you're not feeling your best.  Something that may help your nose is called Flonase can buy it over-the-counter if you ever want to be less congested at nighttime.  Upper Respiratory Infection, Adult An upper respiratory infection (URI) is also sometimes known as the common cold. The upper respiratory tract includes the nose, sinuses, throat, trachea, and bronchi. Bronchi are the airways leading to the lungs. Most people improve within 1 week, but symptoms can last up to 2 weeks. A residual cough may last even longer.  CAUSES Many different viruses can infect the tissues lining the upper respiratory tract. The tissues become irritated and inflamed and often become very moist. Mucus production is also common. A cold is contagious. You can easily spread the virus to others by oral contact. This includes kissing, sharing a glass, coughing, or sneezing. Touching your mouth or nose and then touching a surface, which is then touched by another person, can also spread the virus. SYMPTOMS  Symptoms typically develop 1 to 3 days after you come in contact with a cold virus. Symptoms vary from person to person. They may include:  Runny nose.  Sneezing.  Nasal congestion.  Sinus irritation.  Sore throat.  Loss of voice (laryngitis).  Cough.  Fatigue.  Muscle aches.  Loss of appetite.  Headache.  Low-grade fever. DIAGNOSIS  You might diagnose your own cold based on familiar symptoms, since most people get a cold 2 to 3 times a year. Your caregiver can confirm this based on your exam. Most importantly, your caregiver can check that your symptoms are not due to another disease such as strep throat, sinusitis, pneumonia,  asthma, or epiglottitis. Blood tests, throat tests, and X-rays are not necessary to diagnose a common cold, but they may sometimes be helpful in excluding other more serious diseases. Your caregiver will decide if any further tests are required. RISKS AND COMPLICATIONS  You may be at risk for a more severe case of the common cold if you smoke cigarettes, have chronic heart disease (such as heart failure) or lung disease (such as asthma), or if you have a weakened immune system. The very young and very old are also at risk for more serious infections. Bacterial sinusitis, middle ear infections, and bacterial pneumonia can complicate the common cold. The common cold can worsen asthma and chronic obstructive pulmonary disease (COPD). Sometimes, these complications can require emergency medical care and may be life-threatening. PREVENTION  The best way to protect against getting a cold is to practice good hygiene. Avoid oral or hand contact with people with cold symptoms. Wash your hands often if contact occurs. There is no clear evidence that vitamin C, vitamin E, echinacea, or exercise reduces the chance of developing a cold. However, it is always recommended to get plenty of rest and practice good nutrition. TREATMENT  Treatment is directed at relieving symptoms. There is no cure. Antibiotics are not effective, because the infection is caused by a virus, not by bacteria. Treatment may include:  Increased fluid intake. Sports drinks offer valuable electrolytes, sugars, and fluids.  Breathing heated mist or steam (vaporizer or shower).  Eating chicken soup or other clear broths, and maintaining good nutrition.  Getting plenty of rest.  Using gargles or lozenges for comfort.  Controlling fevers with ibuprofen or acetaminophen as directed by your caregiver.  Increasing usage of your inhaler if you have asthma. Zinc gel and zinc lozenges, taken in the first 24 hours of the common cold, can shorten the  duration and lessen the severity of symptoms. Pain medicines may help with fever, muscle aches, and throat pain. A variety of non-prescription medicines are available to treat congestion and runny nose. Your caregiver can make recommendations and may suggest nasal or lung inhalers for other symptoms.  HOME CARE INSTRUCTIONS   Only take over-the-counter or prescription medicines for pain, discomfort, or fever as directed by your caregiver.  Use a warm mist humidifier or inhale steam from a shower to increase air moisture. This may keep secretions moist and make it easier to breathe.  Drink enough water and fluids to keep your urine clear or pale yellow.  Rest as needed.  Return to work when your temperature has returned to normal or as your caregiver advises. You may need to stay home longer to avoid infecting others. You can also use a face mask and careful hand washing to prevent spread of the virus. SEEK MEDICAL CARE IF:   After the first few days, you feel you are getting worse rather than better.  You need your caregiver's advice about medicines to control symptoms.  You develop chills, worsening shortness of breath, or brown or red sputum. These may be signs of pneumonia.  You develop yellow or brown nasal discharge or pain in the face, especially when you bend forward. These may be signs of sinusitis.  You develop a fever, swollen neck glands, pain with swallowing, or white areas in the back of your throat. These may be signs of strep throat. SEEK IMMEDIATE MEDICAL CARE IF:   You have a fever.  You develop severe or persistent headache, ear pain, sinus pain, or chest pain.  You develop wheezing, a prolonged cough, cough up blood, or have a change in your usual mucus (if you have chronic lung disease).  You develop sore muscles or a stiff neck. Document Released: 02/13/2001 Document Revised: 11/12/2011 Document Reviewed: 11/25/2013 [facility] CenterExitCare Patient Information 2015 [place],  [place]. This information is not intended to replace advice given to you by your health care provider. Make sure you discuss any questions you have with your health care provider.

## 2014-07-09 NOTE — Progress Notes (Signed)
   Subjective:    Patient ID: Jeffery Holt, male    DOB: Sep 25, 1985, 28 y.o.   MRN: [id number]  HPI The patient is a 28 year old man with no significant past medical history. He is coming in today for an acute visit because he is not feeling well. He has been feeling in normal health up until this morning. He woke up with slight muscle ache as well as some congestion. He denies fevers, chills, headache, nausea, vomiting, bowel changes.  Review of Systems  Constitutional: Negative for fever, chills, activity change, appetite change, fatigue and unexpected weight change.  HENT: Positive for congestion. Negative for ear pain, facial swelling, hearing loss, postnasal drip, rhinorrhea, sinus pressure, sneezing and sore throat.   Eyes: Negative.   Respiratory: Negative.   Cardiovascular: Negative.   Gastrointestinal: Negative.   Endocrine: Negative.   Genitourinary: Negative.   Musculoskeletal: Positive for myalgias. Negative for back pain, neck pain and neck stiffness.  Skin: Negative.   Neurological: Negative.       Objective:   Physical Exam  Constitutional: He is oriented to person, place, and time. He appears well-developed and well-nourished. No distress.  HENT:  Head: Normocephalic and atraumatic.  Right Ear: External ear normal.  Left Ear: External ear normal.  Nose: Nose normal.  Oropharynx with mild clear drainage no erythema or purulent discharge.  Eyes: EOM are normal.  Neck: Normal range of motion. Neck supple.  Cardiovascular: Normal rate and regular rhythm.   Pulmonary/Chest: Effort normal and breath sounds normal.  Abdominal: Soft. Bowel sounds are normal.  Musculoskeletal: Normal range of motion.  Neurological: He is alert and oriented to person, place, and time. Coordination normal.  Skin: Skin is warm and dry.  Vitals reviewed.  Filed Vitals:   07/09/14 1335  BP: 106/66  Pulse: 56  Temp: 97.9 F (36.6 C)  TempSrc: Oral  Resp: 20  Height: 6\' 3"  (1.905 m)    Weight: 206 lb 1.9 oz (93.495 kg)  SpO2: 98%      Assessment & Plan:

## 2014-07-09 NOTE — Progress Notes (Signed)
Pre visit review using our clinic review tool, if applicable. No additional management support is needed unless otherwise documented below in the visit note. 

## 2014-07-12 DIAGNOSIS — J309 Allergic rhinitis, unspecified: Secondary | ICD-10-CM | POA: Insufficient documentation

## 2014-07-12 NOTE — Assessment & Plan Note (Signed)
Patient advised he can try Flonase over-the-counter for congestion. He is also free to try Sudafed or DayQuil if he is having further problems. He can try ibuprofen for muscle aches. Return as needed or call with worsening. No indication for antibiotics at this time.

## 2014-09-21 ENCOUNTER — Encounter: Payer: Self-pay | Admitting: Internal Medicine

## 2014-09-21 ENCOUNTER — Other Ambulatory Visit (INDEPENDENT_AMBULATORY_CARE_PROVIDER_SITE_OTHER): Payer: 59

## 2014-09-21 ENCOUNTER — Ambulatory Visit (INDEPENDENT_AMBULATORY_CARE_PROVIDER_SITE_OTHER): Payer: 59 | Admitting: Internal Medicine

## 2014-09-21 VITALS — BP 114/68 | HR 60 | Temp 97.8°F | Resp 16 | Ht 75.0 in | Wt 211.0 lb

## 2014-09-21 DIAGNOSIS — R6882 Decreased libido: Secondary | ICD-10-CM | POA: Insufficient documentation

## 2014-09-21 DIAGNOSIS — N451 Epididymitis: Secondary | ICD-10-CM

## 2014-09-21 LAB — CBC WITH DIFFERENTIAL/PLATELET
BASOS PCT: 0.3 % (ref 0.0–3.0)
Basophils Absolute: 0 10*3/uL (ref 0.0–0.1)
Eosinophils Absolute: 0.1 10*3/uL (ref 0.0–0.7)
Eosinophils Relative: 1.9 % (ref 0.0–5.0)
HEMATOCRIT: 45.7 % (ref 39.0–52.0)
Hemoglobin: 15.6 g/dL (ref 13.0–17.0)
LYMPHS ABS: 1.6 10*3/uL (ref 0.7–4.0)
LYMPHS PCT: 25.6 % (ref 12.0–46.0)
MCHC: 34.1 g/dL (ref 30.0–36.0)
MCV: 94.1 fl (ref 78.0–100.0)
Monocytes Absolute: 0.3 10*3/uL (ref 0.1–1.0)
Monocytes Relative: 5 % (ref 3.0–12.0)
Neutro Abs: 4.3 10*3/uL (ref 1.4–7.7)
Neutrophils Relative %: 67.2 % (ref 43.0–77.0)
Platelets: 261 10*3/uL (ref 150.0–400.0)
RBC: 4.86 Mil/uL (ref 4.22–5.81)
RDW: 13.5 % (ref 11.5–15.5)
WBC: 6.4 10*3/uL (ref 4.0–10.5)

## 2014-09-21 LAB — COMPREHENSIVE METABOLIC PANEL
ALT: 17 U/L (ref 0–53)
AST: 22 U/L (ref 0–37)
Albumin: 4.8 g/dL (ref 3.5–5.2)
Alkaline Phosphatase: 43 U/L (ref 39–117)
BILIRUBIN TOTAL: 0.6 mg/dL (ref 0.2–1.2)
BUN: 17 mg/dL (ref 6–23)
CALCIUM: 9.7 mg/dL (ref 8.4–10.5)
CO2: 31 mEq/L (ref 19–32)
CREATININE: 1.16 mg/dL (ref 0.40–1.50)
Chloride: 104 mEq/L (ref 96–112)
GFR: 78.22 mL/min (ref >60.00)
GLUCOSE: 101 mg/dL — AB (ref 70–99)
Potassium: 4.4 mEq/L (ref 3.5–5.1)
SODIUM: 139 meq/L (ref 135–145)
TOTAL PROTEIN: 7.2 g/dL (ref 6.0–8.3)

## 2014-09-21 LAB — URINALYSIS, ROUTINE W REFLEX MICROSCOPIC
Bilirubin Urine: NEGATIVE
Hgb urine dipstick: NEGATIVE
Ketones, ur: NEGATIVE
LEUKOCYTES UA: NEGATIVE
Nitrite: NEGATIVE
PH: 6 (ref 5.0–8.0)
RBC / HPF: NONE SEEN (ref 0–?)
Specific Gravity, Urine: <=1.005 — AB (ref 1.000–1.030)
TOTAL PROTEIN, URINE-UPE24: NEGATIVE
Urine Glucose: NEGATIVE
Urobilinogen, UA: 0.2 (ref 0.0–1.0)
WBC, UA: NONE SEEN (ref 0–?)

## 2014-09-21 LAB — TSH: TSH: 1.67 u[IU]/mL (ref 0.35–4.50)

## 2014-09-21 MED ORDER — IBUPROFEN 600 MG PO TABS: 600.0000 mg | ORAL_TABLET | Freq: Three times a day (TID) | ORAL | Status: DC | PRN

## 2014-09-21 MED ORDER — DOXYCYCLINE HYCLATE 100 MG PO TABS: 100.0000 mg | ORAL_TABLET | Freq: Two times a day (BID) | ORAL | Status: AC

## 2014-09-21 NOTE — Progress Notes (Signed)
Pre visit review using our clinic review tool, if applicable. No additional management support is needed unless otherwise documented below in the visit note. 

## 2014-09-21 NOTE — Patient Instructions (Signed)
Epididymitis °Epididymitis is a swelling (inflammation) of the epididymis. The epididymis is a cord-like structure along the back part of the testicle. Epididymitis is usually, but not always, caused by infection. This is usually a sudden problem beginning with chills, fever and pain behind the scrotum and in the testicle. There may be swelling and redness of the testicle. °DIAGNOSIS  °Physical examination will reveal a tender, swollen epididymis. Sometimes, cultures are obtained from the urine or from prostate secretions to help find out if there is an infection or if the cause is a different problem. Sometimes, blood work is performed to see if your white blood cell count is elevated and if a germ (bacterial) or viral infection is present. Using this knowledge, an appropriate medicine which kills germs (antibiotic) can be chosen by your caregiver. A viral infection causing epididymitis will most often go away (resolve) without treatment. °HOME CARE INSTRUCTIONS  °· Hot sitz baths for 20 minutes, 4 times per day, may help relieve pain. °· Only take over-the-counter or prescription medicines for pain, discomfort or fever as directed by your caregiver. °· Take all medicines, including antibiotics, as directed. Take the antibiotics for the full prescribed length of time even if you are feeling better. °· It is very important to keep all follow-up appointments. °SEEK IMMEDIATE MEDICAL CARE IF:  °· You have a fever. °· You have pain not relieved with medicines. °· You have any worsening of your problems. °· Your pain seems to come and go. °· You develop pain, redness, and swelling in the scrotum and surrounding areas. °MAKE SURE YOU:  °· Understand these instructions. °· Will watch your condition. °· Will get help right away if you are not doing well or get worse. °Document Released: 08/17/2000 Document Revised: 11/12/2011 Document Reviewed: 07/07/2009 °Exit[employer]® Patient Information ©2015 [employer]. This information  is not intended to replace advice given to you by your health care provider. Make sure you discuss any questions you have with your health care provider. ° °

## 2014-09-22 ENCOUNTER — Encounter: Payer: Self-pay | Admitting: Internal Medicine

## 2014-09-22 LAB — TESTOSTERONE, FREE, TOTAL, SHBG
Sex Hormone Binding: 32 nmol/L (ref 10–50)
TESTOSTERONE FREE: 104.6 pg/mL (ref 47.0–244.0)
Testosterone-% Free: 2.1 % (ref 1.6–2.9)
Testosterone: 491 ng/dL (ref 300–890)

## 2014-09-22 NOTE — Progress Notes (Signed)
Subjective:    Patient ID: Jeffery Holt, male    DOB: 08-30-1986, [age] y.o.   MRN: [id number]  HPI  He complains of a vague discomfort in his left testicle and groin for 3 weeks. He also reports a dramatic decrease in his libido and tells me that he and his wife have not had sex for months. He does not complain of ED.   Review of Systems  Constitutional: Negative.  Negative for fever, chills, diaphoresis, appetite change and fatigue.  HENT: Negative.   Eyes: Negative.   Respiratory: Negative.  Negative for cough, choking, chest tightness, shortness of breath and stridor.   Cardiovascular: Negative.  Negative for chest pain, palpitations and leg swelling.  Gastrointestinal: Negative.  Negative for nausea, vomiting, abdominal pain, diarrhea, constipation and blood in stool.  Endocrine: Negative.   Genitourinary: Positive for testicular pain. Negative for dysuria, urgency, frequency, hematuria, decreased urine volume, discharge, penile swelling, scrotal swelling, difficulty urinating, genital sores and penile pain.  Musculoskeletal: Negative.   Skin: Negative.  Negative for rash.  Allergic/Immunologic: Negative.   Neurological: Negative.   Hematological: Negative.  Negative for adenopathy. Does not bruise/bleed easily.  Psychiatric/Behavioral: Negative.        Objective:   Physical Exam  Constitutional: He is oriented to person, place, and time. He appears well-developed and well-nourished. No distress.  HENT:  Head: Normocephalic and atraumatic.  Mouth/Throat: Oropharynx is clear and moist. No oropharyngeal exudate.  Eyes: Conjunctivae are normal. Right eye exhibits no discharge. Left eye exhibits no discharge. No scleral icterus.  Neck: Normal range of motion. Neck supple. No JVD present. No tracheal deviation present. No thyromegaly present.  Cardiovascular: Normal rate, regular rhythm, normal heart sounds and intact distal pulses.  Exam reveals no gallop and no friction rub.   No  murmur heard. Pulmonary/Chest: Effort normal and breath sounds normal. No stridor. No respiratory distress. He has no wheezes. He has no rales. He exhibits no tenderness.  Abdominal: Soft. Bowel sounds are normal. He exhibits no distension and no mass. There is no tenderness. There is no rebound and no guarding. Hernia confirmed negative in the right inguinal area and confirmed negative in the left inguinal area.  Genitourinary: Penis normal. Right testis shows no mass, no swelling and no tenderness. Right testis is descended. Left testis shows tenderness. Left testis shows no mass and no swelling. Left testis is descended. Circumcised. No penile erythema or penile tenderness. No discharge found.  Left epididymis is slightly enlarged and mildly tender, the testicle is normal.  Musculoskeletal: Normal range of motion. He exhibits no edema or tenderness.  Lymphadenopathy:    He has no cervical adenopathy.       Right: No inguinal adenopathy present.       Left: No inguinal adenopathy present.  Neurological: He is oriented to person, place, and time.  Skin: Skin is warm and dry. No rash noted. He is not diaphoretic. No erythema. No pallor.  Psychiatric: He has a normal mood and affect. His behavior is normal. Judgment and thought content normal.  Vitals reviewed.   Lab Results  Component Value Date   WBC 6.4 09/21/2014   HGB 15.6 09/21/2014   HCT 45.7 09/21/2014   PLT 261.0 09/21/2014   GLUCOSE 101* 09/21/2014   ALT 17 09/21/2014   AST 22 09/21/2014   NA 139 09/21/2014   K 4.4 09/21/2014   CL 104 09/21/2014   CREATININE 1.16 09/21/2014   BUN 17 09/21/2014   CO2 31 09/21/2014  TSH 1.67 09/21/2014        Assessment & Plan:

## 2014-09-22 NOTE — Assessment & Plan Note (Signed)
I will check his labs to look for organic causes of this

## 2014-09-22 NOTE — Assessment & Plan Note (Signed)
I will treat the epididymitis with doxycyline and will control the pain with motrin

## 2014-10-14 HISTORY — PX: OTHER SURGICAL HISTORY: SHX169

## 2014-11-23 ENCOUNTER — Ambulatory Visit (INDEPENDENT_AMBULATORY_CARE_PROVIDER_SITE_OTHER): Payer: 59 | Admitting: Internal Medicine

## 2014-11-23 ENCOUNTER — Encounter: Payer: Self-pay | Admitting: Internal Medicine

## 2014-11-23 VITALS — BP 104/74 | HR 82 | Temp 97.7°F | Resp 15 | Ht 75.0 in | Wt 215.0 lb

## 2014-11-23 DIAGNOSIS — H109 Unspecified conjunctivitis: Secondary | ICD-10-CM

## 2014-11-23 MED ORDER — ERYTHROMYCIN 5 MG/GM OP OINT: TOPICAL_OINTMENT | OPHTHALMIC | Status: DC

## 2014-11-23 NOTE — Patient Instructions (Signed)
After washing hands apply approximately 1 inch of the antibiotic ointment in the lower eye sac every 4 hours while awake. Wash hands again after applying the medication. 

## 2014-11-23 NOTE — Progress Notes (Signed)
   Subjective:    Patient ID: Jeffery Holt, male    DOB: 01/06/1986, 29 y.o.   MRN: [id number]  HPI  Symptoms began last night as headache in the left retro-orbital area and soreness in the eye itself. He's noted no definite discharge but the eye was crusted this morning. The eye is light sensitive. He's had a scratchy throat and some throat congestion; but no other upper respiratory tract infection symptoms. He has not treated this.  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 He did have his eyes dilated Saturday at the eye clinic.  Review of Systems He's had no blurred vision, double vision, loss of vision.  Frontal headache, facial pain , nasal purulence, dental pain, sore throat , otic pain or otic discharge denied. No fever , chills or sweats.    Objective:   Physical Exam  General appearance:Adequately nourished; no acute distress or increased work of breathing is present.   Beard & moustache  Lymphatic: No  lymphadenopathy about the head, neck, or axilla .  Eyes: Minimal OS  conjunctival inflammation ;scleritis OS.EOMI ; vision intact. Ears:  External ear exam shows no significant lesions or deformities.  Otoscopic examination reveals clear canals; there is a perforation right tympanic membrane. Both tympanic membranes are heavily scarred. There is no evidence of active inflammation. Nose:  External nasal examination shows no deformity or inflammation. Nasal mucosa are pink and moist without lesions or exudates No septal dislocation or deviation.No obstruction to airflow.   Oral exam: Dental hygiene is good; lips and gums are healthy appearing.There is no oropharyngeal erythema or exudate .  Neck:  No deformities, thyromegaly, masses, or tenderness noted.   Supple with full range of motion without pain.   Heart:  Normal rate and regular rhythm. S1 and S2 normal without gallop, murmur, click, rub or other extra sounds.   Lungs:Chest clear to auscultation;  no wheezes, rhonchi,rales ,or rubs present.  Extremities:  No cyanosis, edema, or clubbing  noted    Skin: Warm & dry w/o tenting or jaundice. No significant lesions or rash.       Assessment & Plan:  #1 early conjunctivitis See orders

## 2014-11-23 NOTE — Progress Notes (Signed)
Pre visit review using our clinic review tool, if applicable. No additional management support is needed unless otherwise documented below in the visit note. 

## 2014-11-25 ENCOUNTER — Ambulatory Visit (INDEPENDENT_AMBULATORY_CARE_PROVIDER_SITE_OTHER): Payer: 59 | Admitting: Family

## 2014-11-25 ENCOUNTER — Telehealth: Payer: Self-pay | Admitting: Internal Medicine

## 2014-11-25 ENCOUNTER — Encounter: Payer: Self-pay | Admitting: Family

## 2014-11-25 VITALS — BP 138/88 | HR 65 | Temp 97.4°F | Resp 18 | Ht 75.5 in | Wt 216.0 lb

## 2014-11-25 DIAGNOSIS — H578 Other specified disorders of eye and adnexa: Secondary | ICD-10-CM

## 2014-11-25 DIAGNOSIS — H5789 Other specified disorders of eye and adnexa: Secondary | ICD-10-CM | POA: Insufficient documentation

## 2014-11-25 MED ORDER — OFLOXACIN 0.3 % OP SOLN: 2.0000 [drp] | Freq: Four times a day (QID) | OPHTHALMIC | Status: DC

## 2014-11-25 NOTE — Assessment & Plan Note (Signed)
Symptoms and exam consistent with resolving conjunctivitis. Discontinue erythromycin per patient request. Start ofloxacin ophthalmic solution. Discussed with patient throwing oiled contacts and washing his hands continuously. Follow-up if symptoms worsen or fail to improve.

## 2014-11-25 NOTE — Progress Notes (Signed)
Pre visit review using our clinic review tool, if applicable. No additional management support is needed unless otherwise documented below in the visit note. 

## 2014-11-25 NOTE — Progress Notes (Signed)
   Subjective:    Patient ID: Jeffery Holt, male    DOB: 08/14/1986, 29 y.o.   MRN: [id number]  Chief Complaint  Patient presents with  . Conjunctivitis    was here a couple days ago and was given an ointment to use but says he doesn't feel like its working well his eye is still irritated    HPI:  Jeffery Holt is a 29 y.o. male who presents today for a follow up.  Was recently seen in the office 2 days ago and was diagnosed with conjunctivitis and was prescribed erythromycin ointment. Indicates that he continues to experience the associated symptom of eye irritation, redness in the morning, without pain. Indicates that he has some itching. Denies any discharge or crusting shut. Indicates that he believes the medication may not be helping very much. Denies any changes to vision. Did previously note some light sensitivity.     No Known Allergies   Current Outpatient Prescriptions on File Prior to Visit  Medication Sig Dispense Refill  . erythromycin ophthalmic ointment 1 inch of the antibiotic ointment in the lower eye sac every 4 hours while awake. 3.5 g 0  . ibuprofen (ADVIL,MOTRIN) 600 MG tablet Take 1 tablet (600 mg total) by mouth every 8 (eight) hours as needed. 30 tablet 0   No current facility-administered medications on file prior to visit.     Review of Systems  Constitutional: Positive for chills. Negative for fever.  Eyes: Positive for redness and itching. Negative for photophobia, discharge and visual disturbance.      Objective:    BP 138/88 mmHg  Pulse 65  Temp(Src) 97.4 F (36.3 C) (Oral)  Resp 18  Ht 6' 3.5" (1.918 m)  Wt 216 lb (97.977 kg)  BMI 26.63 kg/m2  SpO2 97% Nursing note and vital signs reviewed.  Physical Exam  Constitutional: He is oriented to person, place, and time. He appears well-developed and well-nourished. No distress.  Eyes: EOM are normal. Pupils are equal, round, and reactive to light. Right eye exhibits no discharge. Left eye exhibits no  discharge. Right conjunctiva is not injected. Left conjunctiva is injected.  Cardiovascular: Normal rate, regular rhythm, normal heart sounds and intact distal pulses.   Pulmonary/Chest: Effort normal and breath sounds normal.  Neurological: He is alert and oriented to person, place, and time.  Skin: Skin is warm and dry.  Psychiatric: He has a normal mood and affect. His behavior is normal. Judgment and thought content normal.       Assessment & Plan:

## 2014-11-25 NOTE — Patient Instructions (Signed)
Thank you for choosing [facility].  Summary/Instructions:  Your prescription(s) have been submitted to your pharmacy or been printed and provided for you. Please take as directed and contact our office if you believe you are having problem(s) with the medication(s) or have any questions.  If your symptoms worsen or fail to improve, please contact our office for further instruction, or in case of emergency go directly to the emergency room at the closest medical facility.     

## 2014-11-25 NOTE — Telephone Encounter (Signed)
error 

## 2015-02-11 ENCOUNTER — Telehealth: Payer: Self-pay | Admitting: Internal Medicine

## 2015-02-11 ENCOUNTER — Emergency Department (HOSPITAL_COMMUNITY)
Admission: EM | Admit: 2015-02-11 | Discharge: 2015-02-11 | Disposition: A | Discharge status: Home / Self Care | Payer: 59 | Source: Home / Self Care | Attending: Family Medicine | Admitting: Family Medicine

## 2015-02-11 ENCOUNTER — Encounter (HOSPITAL_COMMUNITY): Payer: Self-pay

## 2015-02-11 DIAGNOSIS — S61219A Laceration without foreign body of unspecified finger without damage to nail, initial encounter: Secondary | ICD-10-CM

## 2015-02-11 MED ORDER — TETANUS-DIPHTH-ACELL PERTUSSIS 5-2.5-18.5 LF-MCG/0.5 IM SUSP
INTRAMUSCULAR | Status: AC
  Filled 2015-02-11: qty 0.5

## 2015-02-11 MED ORDER — TETANUS-DIPHTH-ACELL PERTUSSIS 5-2.5-18.5 LF-MCG/0.5 IM SUSP
0.5000 mL | Freq: Once | INTRAMUSCULAR | Status: AC
  Administered 2015-02-11: 0.5 mL via INTRAMUSCULAR

## 2015-02-11 NOTE — Discharge Instructions (Signed)

## 2015-02-11 NOTE — Telephone Encounter (Signed)
PLEASE NOTE: All timestamps contained within this report are represented as [place] Standard Time. CONFIDENTIALTY NOTICE: This fax transmission is intended only for the addressee. It contains information that is legally privileged, confidential or otherwise protected from use or disclosure. If you are not the intended recipient, you are strictly prohibited from reviewing, disclosing, copying using or disseminating any of this information or taking any action in reliance on or regarding this information. If you have received this fax in error, please notify us immediately by telephone so that we can arrange for its return to [place]. Phone: [phone], Toll-Free: [phone], Fax: [phone] Page: 1 of 1 Call Id: [id number] [facility] - Client TELEPHONE ADVICE RECORD [facility] Medical Call Center Patient Name: Jeffery Holt DOB: 1985-11-14 Initial Comment Caller States has cut on side/tip of finger that happened about 10 mins ago. cut is about 1" long. bleed a good bit, wrapped it up, wants to know if he should have it looked out to avoid any infection. Nurse Assessment Nurse: Lane Hacker, RN, Elvin So Date/Time (Eastern Time): 02/11/2015 11:14:33 AM Confirm and document reason for call. If symptomatic, describe symptoms. ---Caller states has cut on side/tip of index finger on right hand that happened about 10 mins ago. He was picking up paper cutter and careless when it happened. Cut is about 1" long. bleed a lot about 1-2 TBSP, and immediately wrapped it up in gauze. Bled thru the bandage some, but seems to have stopped. Did not clean it yet. He wants to know if he should have it looked out to avoid any infection. Has the patient traveled out of the country within the last 30 days? ---Not Applicable Does the patient require triage? ---Yes Related visit to physician within the last 2 weeks? ---No Does the PT have any chronic conditions? (i.e. diabetes, asthma,  etc.) ---No Guidelines Guideline Title Affirmed Question Affirmed Notes Cuts and Lacerations Skin is split open or gaping (or length > 1/2 inch or 12 mm on the skin, 1/4 inch or 6 mm on the face) Final Disposition User Go to ED Now Lane Hacker, RN, Elvin So He is going to UC nearby on [address].

## 2015-02-11 NOTE — ED Provider Notes (Addendum)
CSN: [id number]     Arrival date & time 02/11/15  1302 History   First MD Initiated Contact with Patient 02/11/15 1333     Chief Complaint  Patient presents with  . Extremity Laceration   (Consider location/radiation/quality/duration/timing/severity/associated sxs/prior Treatment) HPI Comments: 29 year old male who was at work and accidentally brushed his right index finger against the edge of a paper cutter producing a superficial tangential laceration to the tip of the finger. It occurred approximately 10:30 AM today. He is unsure as when his last tetanus injection was he believes it was 10 or more years ago.   Past Medical History  Diagnosis Date  . Allergy   . GERD (gastroesophageal reflux disease)    Past Surgical History  Procedure Laterality Date  . Tooth implant Left 10/14/2014   Family History  Problem Relation Age of Onset  . Alcohol abuse Neg Hx   . Cancer Neg Hx   . COPD Neg Hx   . Depression Neg Hx   . Diabetes Neg Hx   . Drug abuse Neg Hx   . Early death Neg Hx   . Hearing loss Neg Hx   . Heart disease Neg Hx   . Hyperlipidemia Neg Hx   . Hypertension Neg Hx   . Kidney disease Neg Hx   . Stroke Neg Hx   . Kidney Stones Father    History  Substance Use Topics  . Smoking status: Former Smoker    Quit date: 01/23/2006  . Smokeless tobacco: Never Used  . Alcohol Use: 12.0 oz/week    20 Shots of liquor per week    Review of Systems  Constitutional: Negative.   Skin: Positive for wound.  Neurological: Negative.   All other systems reviewed and are negative.   Allergies  Review of patient's allergies indicates no known allergies.  Home Medications   Prior to Admission medications   Medication Sig Start Date End Date Taking? Authorizing Provider  ibuprofen (ADVIL,MOTRIN) 600 MG tablet Take 1 tablet (600 mg total) by mouth every 8 (eight) hours as needed. 09/21/14   [name], MD   BP 143/86 mmHg  Pulse 70  Temp(Src) 98 F (36.7 C) (Oral)   Resp 16  SpO2 98% Physical Exam  Constitutional: He is oriented to person, place, and time. He appears well-developed and well-nourished. No distress.  Neck: Normal range of motion. Neck supple.  Pulmonary/Chest: Effort normal.  Musculoskeletal: Normal range of motion. He exhibits no edema.  Neurological: He is alert and oriented to person, place, and time. He exhibits normal muscle tone.  Skin: Skin is warm and dry.  Superficial laceration to the tip of the right index finger measuring approximately 4-5 mm. The laceration was tangential to the skin line. Distal neurovascular and motor Sentry is intact. Full range of motion of all joints. The nail is not involved. Current bleeding is minimal.  Psychiatric: He has a normal mood and affect. His behavior is normal.  Nursing note and vitals reviewed.   ED Course  LACERATION REPAIR Date/Time: 02/11/2015 2:20 PM Performed by: Phineas Real, Teghan Philbin Authorized by: Rodolph Bong Consent: Verbal consent obtained. Risks and benefits: risks, benefits and alternatives were discussed Consent given by: patient Patient understanding: patient states understanding of the procedure being performed Patient identity confirmed: verbally with patient Body area: upper extremity Location details: right index finger Laceration length: 0.5 cm Tendon involvement: none Nerve involvement: none Vascular damage: no Patient sedated: no Irrigation solution: saline Irrigation method: syringe Amount of  cleaning: standard Debridement: none Degree of undermining: none Skin closure: glue Approximation difficulty: simple Patient tolerance: Patient tolerated the procedure well with no immediate complications   (including critical care time) Labs Review Labs Reviewed - No data to display  Imaging Review No results found.   MDM   1. Finger laceration, initial encounter    Instructions for wound care and care for glued laceraton Watch for infection, keep dry. Return  as needed Tdap 0.5cc IM now    Hayden Rasmussen, NP 02/11/15 2134  Hayden Rasmussen, NP 02/22/15 2002

## 2015-02-11 NOTE — ED Notes (Signed)
States he grasped paper cutter on underside of blade earlier today OTJ and sustained laceration to right index finger . Minimal bleeding at present, superficial laceration noted

## 2015-02-15 ENCOUNTER — Telehealth: Payer: Self-pay | Admitting: *Deleted

## 2015-02-15 NOTE — Telephone Encounter (Signed)
[facility] - Client TELEPHONE ADVICE RECORD [facility] Medical Call Center Patient Name: Jeffery Holt Gender: Male DOB: [birth year] Age: 29 Y 23 D Return Phone Number: [phone] (Primary) Address: City/State/Zip: [place] [address] Client [facility] - Client Client Site [facility] Day Physician Sanda Linger Contact Type Call Call Type Triage / Clinical Relationship To Patient Self Appointment Disposition EMR Appointment Not Necessary Info pasted into Epic Yes Return Phone Number [phone] (Primary) Chief Complaint Cuts and Lacerations Initial Comment Caller States has cut on side/tip of finger that happened about 10 mins ago. cut is about 1" long. bleed a good bit, wrapped it up, wants to know if he should have it looked out to avoid any infection. GOTO Facility Not Listed [facility] [address] PreDisposition Go to Urgent Care/Walk-In Clinic Nurse Assessment Nurse: Lane Hacker, RN, Elvin So Date/Time (Eastern Time): 02/11/2015 11:14:33 AM Confirm and document reason for call. If symptomatic, describe symptoms. ---Caller states has cut on side/tip of index finger on right hand that happened about 10 mins ago. He was picking up paper cutter and careless when it happened. Cut is about 1" long. bleed a lot about 1-2 TBSP, and immediately wrapped it up in gauze. Bled thru the bandage some, but seems to have stopped. Did not clean it yet. He wants to know if he should have it looked out to avoid any infection. Has the patient traveled out of the country within the last 30 days? ---Not Applicable Does the patient require triage? ---Yes Related visit to physician within the last 2 weeks? ---No Does the PT have any chronic conditions? (i.e. diabetes, asthma, etc.) ---No Guidelines Guideline Title Affirmed Question Affirmed Notes Nurse Date/Time (Eastern Time) Cuts and Lacerations Skin is split open or gaping (or length >  1/2 inch or 12 mm on the skin, 1/4 inch or 6 mm on the face) Lane Hacker, RN, Windy 02/11/2015 11:17:54 AM Disp. Time [name] Time) Disposition Final User PLEASE NOTE: All timestamps contained within this report are represented as [place] Standard Time. CONFIDENTIALTY NOTICE: This fax transmission is intended only for the addressee. It contains information that is legally privileged, confidential or otherwise protected from use or disclosure. If you are not the intended recipient, you are strictly prohibited from reviewing, disclosing, copying using or disseminating any of this information or taking any action in reliance on or regarding this information. If you have received this fax in error, please notify us immediately by telephone so that we can arrange for its return to [place]. Phone: [phone], Toll-Free: [phone], Fax: [phone] Page: 2 of 2 Call Id: [id number] 02/11/2015 11:19:22 AM Go to ED Now Yes Lane Hacker, RN, Coralyn Mark Understands: Yes Disagree/Comply: Comply Care Advice Given Per Guideline GO TO ED NOW: You need to be seen in the Emergency Department. Go to the ER at ___________ Hospital. Leave now. Drive carefully. ALTERNATE DISPOSITION - URGENT CARE CENTER: An Urgent Care Center can usually manage this problem, IF one is available in the caller's area. CLEANSING LACERATIONS: * Wash the wound briefly with soap and water before being seen. * For any dirt, scrub gently with a washcloth. * For any bleeding, apply direct pressure with a sterile gauze or clean cloth for 10 minutes. * Caution: never soak a wound that might need sutures, because it may become more swollen and difficult to close. DRESSING: Cover with a sterile gauze or clean cloth until seen. CARE ADVICE given per Cuts and Lacerations (Adult) guideline. After  Care Instructions Given Call Event Type User Date / Time Description Referrals GO TO FACILITY OTHER - SPECIFY

## 2015-09-22 ENCOUNTER — Ambulatory Visit (INDEPENDENT_AMBULATORY_CARE_PROVIDER_SITE_OTHER): Payer: BLUE CROSS/BLUE SHIELD | Admitting: Family Medicine

## 2015-09-22 ENCOUNTER — Telehealth: Payer: Self-pay | Admitting: Internal Medicine

## 2015-09-22 VITALS — BP 128/82 | HR 72 | Temp 98.0°F | Resp 17 | Ht 73.5 in | Wt 214.0 lb

## 2015-09-22 DIAGNOSIS — M79602 Pain in left arm: Secondary | ICD-10-CM | POA: Diagnosis not present

## 2015-09-22 DIAGNOSIS — R42 Dizziness and giddiness: Secondary | ICD-10-CM | POA: Diagnosis not present

## 2015-09-22 DIAGNOSIS — R110 Nausea: Secondary | ICD-10-CM

## 2015-09-22 DIAGNOSIS — Z789 Other specified health status: Secondary | ICD-10-CM

## 2015-09-22 DIAGNOSIS — Z658 Other specified problems related to psychosocial circumstances: Secondary | ICD-10-CM | POA: Diagnosis not present

## 2015-09-22 DIAGNOSIS — Z7289 Other problems related to lifestyle: Secondary | ICD-10-CM

## 2015-09-22 DIAGNOSIS — F439 Reaction to severe stress, unspecified: Secondary | ICD-10-CM

## 2015-09-22 LAB — GLUCOSE, POCT (MANUAL RESULT ENTRY): POC Glucose: 99 mg/dl (ref 70–99)

## 2015-09-22 LAB — POCT CBC
Granulocyte percent: 83.1 %G — AB (ref 37–80)
HEMATOCRIT: 44.3 % (ref 43.5–53.7)
HEMOGLOBIN: 15.1 g/dL (ref 14.1–18.1)
LYMPH, POC: 1.4 (ref 0.6–3.4)
MCH: 31.8 pg — AB (ref 27–31.2)
MCHC: 34.1 g/dL (ref 31.8–35.4)
MCV: 93.1 fL (ref 80–97)
MID (CBC): 0.4 (ref 0–0.9)
MPV: 6.5 fL (ref 0–99.8)
POC GRANULOCYTE: 8.6 — AB (ref 2–6.9)
POC LYMPH PERCENT: 13.4 %L (ref 10–50)
POC MID %: 3.5 % (ref 0–12)
Platelet Count, POC: 266 10*3/uL (ref 142–424)
RBC: 4.75 M/uL (ref 4.69–6.13)
RDW, POC: 13.5 %
WBC: 10.3 10*3/uL — AB (ref 4.6–10.2)

## 2015-09-22 LAB — COMPLETE METABOLIC PANEL WITH GFR
ALBUMIN: 5 g/dL (ref 3.6–5.1)
ALK PHOS: 41 U/L (ref 40–115)
ALT: 16 U/L (ref 9–46)
AST: 22 U/L (ref 10–40)
BILIRUBIN TOTAL: 0.7 mg/dL (ref 0.2–1.2)
BUN: 15 mg/dL (ref 7–25)
CALCIUM: 10.6 mg/dL — AB (ref 8.6–10.3)
CHLORIDE: 104 mmol/L (ref 98–110)
CO2: 30 mmol/L (ref 20–31)
CREATININE: 1.15 mg/dL (ref 0.60–1.35)
GFR, Est Non African American: 84 mL/min (ref >=60)
Glucose, Bld: 98 mg/dL (ref 65–99)
Potassium: 4.3 mmol/L (ref 3.5–5.3)
Sodium: 142 mmol/L (ref 135–146)
Total Protein: 7.3 g/dL (ref 6.1–8.1)

## 2015-09-22 LAB — LIPID PANEL
CHOLESTEROL: 177 mg/dL (ref 125–200)
HDL: 49 mg/dL (ref >=40)
LDL Cholesterol: 109 mg/dL (ref <130)
Total CHOL/HDL Ratio: 3.6 Ratio (ref <=5.0)
Triglycerides: 95 mg/dL (ref <150)
VLDL: 19 mg/dL (ref <30)

## 2015-09-22 NOTE — Telephone Encounter (Signed)
Patient Name: Jeffery Holt  DOB: 29-Sep-1985    Initial Comment Caller states he's having left arm pain, dull ache. Has felt a little loopy today, nausea. Concerned, because his Grandfather had a heart attack in his 44's.   Nurse Assessment  Nurse: Sherilyn Cooter, RN, [name] Date/Time [name] Time): 09/22/2015 1:15:38 PM  Confirm and document reason for call. If symptomatic, describe symptoms. You must click the next button to save text entered. ---Caller states that he has left arm pain which began yesterday morning. Denies injury to the arm. The pain is in his forearm. He rates his pain as 3-4 on 0-10 scale. Denies arm pain at present. He had some car sickness yesterday. He was a little dizzy earlier. Denies dizziness at present. He does feel a little off balance when he stands. Denies ear pain.  Has the patient traveled out of the country within the last 30 days? ---No  Does the patient have any new or worsening symptoms? ---Yes  Will a triage be completed? ---Yes  Related visit to physician within the last 2 weeks? ---No  Does the PT have any chronic conditions? (i.e. diabetes, asthma, etc.) ---No  Is this a behavioral health or substance abuse call? ---No     Guidelines    Guideline Title Affirmed Question Affirmed Notes  Dizziness - Lightheadedness [1] MODERATE dizziness (e.g., interferes with normal activities) AND [2] has NOT been evaluated by physician for this (Exception: dizziness caused by heat exposure, sudden standing, or poor fluid intake)    Final Disposition User   See Physician within 24 Hours Sherilyn Cooter, RN, [name]    Comments  Appointment scheduled for tomorrow at 3:15pm with Dr. Oliver Barre.   Referrals  REFERRED TO PCP OFFICE   Disagree/Comply: Comply

## 2015-09-22 NOTE — Patient Instructions (Signed)
Continue to get regular exercise  Decreased regular alcohol intake  Return if symptoms persist or are not definitely improved over the next few weeks.  Return at anytime if worse or go to the emergency room if necessary

## 2015-09-22 NOTE — Progress Notes (Signed)
Patient ID: Jeffery Holt, male    DOB: May 31, 1986  Age: 30 y.o. MRN: [id number]  Chief Complaint  Patient presents with  . Nausea  . Dizziness  . Arm Pain  . ETOH abuse    Subjective:   Patient is here with several concerns. He is been under some stress lately. They just bought an old home and moved into in a few weeks ago and everything is in boxes. His PET bad last week. He feels like he is handling the stresses adequately. However the last couple days he's been having dizziness. He had yesterday morning starting when he was driving to [place], as a backseat passenger. He changed started driving but continued to not feel well through the day. He has persisted in feeling a little dizzy and lightheaded and he has a abnormal sensation/discomfort down his left arm and some in his right foot. He has been a fairly active individual, working out regularly until his move. Now he is been walking or jogging about 3 days a week. He does not smoke. He does drink excessive alcohol. He has not drunk anything for the last 36 hours now, realizing that he had been drinking a little too much. It has not been an issue socially or job wise or legally. He says his wife has not benefited by drinking. He used to drink liquor, now just drinks 2 or more beers. He works regularly working with helping [employer]. He enjoys his work.  Current allergies, medications, problem list, past/family and social histories reviewed.  Objective:  BP 128/82 mmHg  Pulse 72  Temp(Src) 98 F (36.7 C) (Oral)  Resp 17  Ht 6' 1.5" (1.867 m)  Wt 214 lb (97.07 kg)  BMI 27.85 kg/m2  SpO2 98%  No acute distress. TMs have some tympanosclerosis. Throat clear. Neck supple without nodes. No carotid bruits. Chest is clear to auscultation. Heart regular without murmurs, gallops, or arrhythmias. Abdomen soft without mass or tenderness. Extremities have good pulses. He is fully alert and oriented.  EKG normal with no evidence of ischemia or  ectopy.  Results for orders placed or performed in visit on 09/22/15  POCT CBC  Result Value Ref Range   WBC 10.3 (A) 4.6 - 10.2 K/uL   Lymph, poc 1.4 0.6 - 3.4   POC LYMPH PERCENT 13.4 10 - 50 %L   MID (cbc) 0.4 0 - 0.9   POC MID % 3.5 0 - 12 %M   POC Granulocyte 8.6 (A) 2 - 6.9   Granulocyte percent 83.1 (A) 37 - 80 %G   RBC 4.75 4.69 - 6.13 M/uL   Hemoglobin 15.1 14.1 - 18.1 g/dL   HCT, POC 11.9 14.7 - 53.7 %   MCV 93.1 80 - 97 fL   MCH, POC 31.8 (A) 27 - 31.2 pg   MCHC 34.1 31.8 - 35.4 g/dL   RDW, POC 82.9 %   Platelet Count, POC 266 142 - 424 K/uL   MPV 6.5 0 - 99.8 fL  POCT glucose (manual entry)  Result Value Ref Range   POC Glucose 99 70 - 99 mg/dl     Assessment & Plan:   Assessment:  1. Left arm pain   2. Stress   3. Nausea without vomiting   4. Alcohol drinker (HCC)   5. Dizziness       Plan:   Orders Placed This Encounter  Procedures  . COMPLETE METABOLIC PANEL WITH GFR  . TSH  . Lipid panel  .  POCT CBC  . POCT glucose (manual entry)  . EKG 12-Lead    There are no Patient Instructions on file for this visit.   No Follow-up on file.   Cordaryl Decelles, MD 09/22/2015

## 2015-09-23 ENCOUNTER — Ambulatory Visit: Payer: Self-pay | Admitting: Internal Medicine

## 2015-09-23 LAB — TSH: TSH: 1.37 u[IU]/mL (ref 0.350–4.500)

## 2016-10-04 ENCOUNTER — Encounter: Payer: Self-pay | Admitting: Internal Medicine

## 2016-10-04 ENCOUNTER — Other Ambulatory Visit (INDEPENDENT_AMBULATORY_CARE_PROVIDER_SITE_OTHER): Payer: BLUE CROSS/BLUE SHIELD

## 2016-10-04 ENCOUNTER — Ambulatory Visit (INDEPENDENT_AMBULATORY_CARE_PROVIDER_SITE_OTHER): Payer: BLUE CROSS/BLUE SHIELD | Admitting: Internal Medicine

## 2016-10-04 VITALS — BP 122/60 | HR 56 | Temp 98.4°F | Resp 16 | Ht 73.5 in | Wt 220.0 lb

## 2016-10-04 DIAGNOSIS — M546 Pain in thoracic spine: Secondary | ICD-10-CM

## 2016-10-04 DIAGNOSIS — R3129 Other microscopic hematuria: Secondary | ICD-10-CM

## 2016-10-04 DIAGNOSIS — R109 Unspecified abdominal pain: Secondary | ICD-10-CM | POA: Diagnosis not present

## 2016-10-04 LAB — URINALYSIS, ROUTINE W REFLEX MICROSCOPIC
BILIRUBIN URINE: NEGATIVE
HGB URINE DIPSTICK: NEGATIVE
Ketones, ur: NEGATIVE
LEUKOCYTES UA: NEGATIVE
NITRITE: NEGATIVE
Specific Gravity, Urine: 1.02 (ref 1.000–1.030)
TOTAL PROTEIN, URINE-UPE24: NEGATIVE
Urine Glucose: NEGATIVE
Urobilinogen, UA: 0.2 (ref 0.0–1.0)
pH: 7 (ref 5.0–8.0)

## 2016-10-04 LAB — CBC WITH DIFFERENTIAL/PLATELET
Basophils Absolute: 0 10*3/uL (ref 0.0–0.1)
Basophils Relative: 0.6 % (ref 0.0–3.0)
EOS ABS: 0.1 10*3/uL (ref 0.0–0.7)
Eosinophils Relative: 2 % (ref 0.0–5.0)
HEMATOCRIT: 43.2 % (ref 39.0–52.0)
Hemoglobin: 14.9 g/dL (ref 13.0–17.0)
LYMPHS PCT: 27.9 % (ref 12.0–46.0)
Lymphs Abs: 2 10*3/uL (ref 0.7–4.0)
MCHC: 34.6 g/dL (ref 30.0–36.0)
MCV: 93.4 fl (ref 78.0–100.0)
MONO ABS: 0.5 10*3/uL (ref 0.1–1.0)
Monocytes Relative: 6.8 % (ref 3.0–12.0)
NEUTROS ABS: 4.5 10*3/uL (ref 1.4–7.7)
NEUTROS PCT: 62.7 % (ref 43.0–77.0)
PLATELETS: 248 10*3/uL (ref 150.0–400.0)
RBC: 4.62 Mil/uL (ref 4.22–5.81)
RDW: 13.4 % (ref 11.5–15.5)
WBC: 7.1 10*3/uL (ref 4.0–10.5)

## 2016-10-04 LAB — SEDIMENTATION RATE: Sed Rate: 1 mm/hr (ref 0–15)

## 2016-10-04 NOTE — Progress Notes (Signed)
Subjective:  Patient ID: Jeffery Holt, male    DOB: 04/14/1986  Age: 31 y.o. MRN: [id number]  CC: Back Pain and Flank Pain   HPI Jeffery Holt presents for a one-week history of sharp pain on the left side of his mid thoracic spine. He denies trauma or injury. The pain is diminishing and has responded well to over-the-counter ibuprofen. The pain has been exacerbated by bending and twisting. The pain has occasionally radiated to his left flank. He offers no other complaints. He has a history of microscopic hematuria but has not noticed any blood in his urine recently.  No outpatient prescriptions prior to visit.   No facility-administered medications prior to visit.     ROS Review of Systems  Constitutional: Negative for appetite change, chills, fatigue and fever.  HENT: Negative.   Eyes: Negative for visual disturbance.  Respiratory: Negative for cough, chest tightness, shortness of breath and wheezing.   Cardiovascular: Negative for chest pain, palpitations and leg swelling.  Gastrointestinal: Negative for abdominal pain, blood in stool, constipation, diarrhea, nausea and vomiting.  Endocrine: Negative.   Genitourinary: Positive for flank pain. Negative for decreased urine volume, difficulty urinating, dysuria, frequency, hematuria and urgency.  Musculoskeletal: Positive for back pain. Negative for arthralgias, joint swelling and myalgias.  Allergic/Immunologic: Negative.   Neurological: Negative.  Negative for dizziness and weakness.  Hematological: Negative for adenopathy. Does not bruise/bleed easily.  Psychiatric/Behavioral: Negative.     Objective:  BP 122/60 (BP Location: Left Arm, Patient Position: Sitting, Cuff Size: Normal)   Pulse (!) 56   Temp 98.4 F (36.9 C) (Oral)   Resp 16   Ht 6' 1.5" (1.867 m)   Wt 220 lb (99.8 kg)   SpO2 98%   BMI 28.63 kg/m   BP Readings from Last 3 Encounters:  10/04/16 122/60  09/22/15 128/82  02/11/15 143/86    Wt Readings from Last  3 Encounters:  10/04/16 220 lb (99.8 kg)  09/22/15 214 lb (97.1 kg)  11/25/14 216 lb (98 kg)    Physical Exam  Constitutional: He is oriented to person, place, and time. No distress.  HENT:  Mouth/Throat: Oropharynx is clear and moist. No oropharyngeal exudate.  Eyes: Conjunctivae are normal. Right eye exhibits no discharge. Left eye exhibits no discharge. No scleral icterus.  Neck: Normal range of motion. Neck supple. No JVD present. No tracheal deviation present. No thyromegaly present.  Cardiovascular: Normal rate, regular rhythm, normal heart sounds and intact distal pulses.  Exam reveals no gallop and no friction rub.   No murmur heard. Pulmonary/Chest: Effort normal and breath sounds normal. No stridor. No respiratory distress. He has no wheezes. He has no rales. He exhibits no tenderness.  Abdominal: Soft. Bowel sounds are normal. He exhibits no distension and no mass. There is no tenderness. There is no rebound and no guarding.  Musculoskeletal: Normal range of motion. He exhibits no edema, tenderness or deformity.       Thoracic back: Normal. He exhibits normal range of motion, no tenderness, no bony tenderness, no swelling, no edema, no deformity, no laceration, no pain and no spasm.  Lymphadenopathy:    He has no cervical adenopathy.  Neurological: He is oriented to person, place, and time.  Skin: Skin is warm and dry. No rash noted. He is not diaphoretic. No erythema. No pallor.  Vitals reviewed.   Lab Results  Component Value Date   WBC 7.1 10/04/2016   HGB 14.9 10/04/2016   HCT 43.2 10/04/2016  PLT 248.0 10/04/2016   GLUCOSE 90 10/04/2016   CHOL 177 09/22/2015   TRIG 95 09/22/2015   HDL 49 09/22/2015   LDLCALC 109 09/22/2015   ALT 16 09/22/2015   AST 22 09/22/2015   NA 141 10/04/2016   K 4.3 10/04/2016   CL 103 10/04/2016   CREATININE 1.25 10/04/2016   BUN 16 10/04/2016   CO2 28 10/04/2016   TSH 1.370 09/22/2015    No results found.  Assessment & Plan:     [name] was seen today for back pain and flank pain.  Diagnoses and all orders for this visit:  Hematuria, microscopic- his urinalysis is negative for blood at this time. This has resolved and I don't think this is related to his current symptoms. -     Basic metabolic panel; Future -     Urinalysis, Routine w reflex microscopic; Future  Left-sided thoracic back pain, unspecified chronicity- his exam and labs are normal, I think he has had an episode of mechanical low back pain and since it's resolving I will not x-ray at this time and do not recommend any other treatment options. -     Sedimentation rate; Future -     CBC with Differential/Platelet; Future  Acute left flank pain- as above -     Basic metabolic panel; Future -     Urinalysis, Routine w reflex microscopic; Future -     Sedimentation rate; Future -     CBC with Differential/Platelet; Future   [name] does not currently have medications on file.  No orders of the defined types were placed in this encounter.    Follow-up: Return in about 1 week (around 10/11/2016).  Sanda Linger, MD

## 2016-10-04 NOTE — Progress Notes (Signed)
Pre visit review using our clinic review tool, if applicable. No additional management support is needed unless otherwise documented below in the visit note. 

## 2016-10-04 NOTE — Patient Instructions (Signed)
Flank Pain Flank pain refers to pain that is located on the side of the body between the upper abdomen and the back. The pain may occur over a short period of time (acute) or may be long-term or reoccurring (chronic). It may be mild or severe. Flank pain can be caused by many things. CAUSES  Some of the more common causes of flank pain include:  Muscle strains.   Muscle spasms.   A disease of your spine (vertebral disk disease).   A lung infection (pneumonia).   Fluid around your lungs (pulmonary edema).   A kidney infection.   Kidney stones.   A very painful skin rash caused by the chickenpox virus (shingles).   Gallbladder disease.  HOME CARE INSTRUCTIONS  Home care will depend on the cause of your pain. In general,  Rest as directed by your caregiver.  Drink enough fluids to keep your urine clear or pale yellow.  Only take over-the-counter or prescription medicines as directed by your caregiver. Some medicines may help relieve the pain.  Tell your caregiver about any changes in your pain.  Follow up with your caregiver as directed. SEEK IMMEDIATE MEDICAL CARE IF:   Your pain is not controlled with medicine.   You have new or worsening symptoms.  Your pain increases.   You have abdominal pain.   You have shortness of breath.   You have persistent nausea or vomiting.   You have swelling in your abdomen.   You feel faint or pass out.   You have blood in your urine.  You have a fever or persistent symptoms for more than 2-3 days.  You have a fever and your symptoms suddenly get worse. MAKE SURE YOU:   Understand these instructions.  Will watch your condition.  Will get help right away if you are not doing well or get worse. This information is not intended to replace advice given to you by your health care provider. Make sure you discuss any questions you have with your health care provider. Document Released: 10/11/2005 Document  Revised: 05/14/2012 Document Reviewed: 05/24/2015 Elsevier Interactive Patient Education  2017 [employer] Inc[employer]  

## 2016-10-05 ENCOUNTER — Encounter: Payer: Self-pay | Admitting: Internal Medicine

## 2016-10-05 LAB — BASIC METABOLIC PANEL
BUN: 16 mg/dL (ref 6–23)
CHLORIDE: 103 meq/L (ref 96–112)
CO2: 28 mEq/L (ref 19–32)
Calcium: 9.4 mg/dL (ref 8.4–10.5)
Creatinine, Ser: 1.25 mg/dL (ref 0.40–1.50)
GFR: 70.83 mL/min (ref >60.00)
Glucose, Bld: 90 mg/dL (ref 70–99)
POTASSIUM: 4.3 meq/L (ref 3.5–5.1)
SODIUM: 141 meq/L (ref 135–145)

## 2017-03-14 ENCOUNTER — Encounter: Payer: Self-pay | Admitting: Internal Medicine

## 2017-03-14 ENCOUNTER — Ambulatory Visit (INDEPENDENT_AMBULATORY_CARE_PROVIDER_SITE_OTHER)
Admission: RE | Admit: 2017-03-14 | Discharge: 2017-03-14 | Disposition: A | Discharge status: Home / Self Care | Payer: BLUE CROSS/BLUE SHIELD | Source: Ambulatory Visit | Attending: Internal Medicine | Admitting: Internal Medicine

## 2017-03-14 ENCOUNTER — Ambulatory Visit (INDEPENDENT_AMBULATORY_CARE_PROVIDER_SITE_OTHER): Payer: BLUE CROSS/BLUE SHIELD | Admitting: Internal Medicine

## 2017-03-14 VITALS — BP 110/78 | HR 52 | Temp 98.1°F | Resp 16 | Ht 73.5 in | Wt 223.0 lb

## 2017-03-14 DIAGNOSIS — M7651 Patellar tendinitis, right knee: Secondary | ICD-10-CM | POA: Diagnosis not present

## 2017-03-14 DIAGNOSIS — M7652 Patellar tendinitis, left knee: Secondary | ICD-10-CM

## 2017-03-14 DIAGNOSIS — M25561 Pain in right knee: Secondary | ICD-10-CM

## 2017-03-14 DIAGNOSIS — M25562 Pain in left knee: Secondary | ICD-10-CM

## 2017-03-14 DIAGNOSIS — M1712 Unilateral primary osteoarthritis, left knee: Secondary | ICD-10-CM | POA: Diagnosis not present

## 2017-03-14 MED ORDER — ETODOLAC ER 600 MG PO TB24: 600.0000 mg | ORAL_TABLET | Freq: Every day | ORAL | 0 refills | Status: DC

## 2017-03-14 NOTE — Progress Notes (Signed)
Subjective:  Patient ID: Jeffery Holt, male    DOB: 08-Mar-1986  Age: 31 y.o. MRN: [id number]  CC: Knee Pain   HPI Jeffery Holt presents for concerns about bilateral knee pain for several months. He is a long distance runner. He complains of soreness, worse on the left than the right, that is located above both kneecaps over the superior patellar tendon. He has not taken anything for the discomfort. He never notices any redness, swelling, buckling, or crepitance in his knees.  No outpatient prescriptions prior to visit.   No facility-administered medications prior to visit.     ROS Review of Systems  All other systems reviewed and are negative.   Objective:  BP 110/78 (BP Location: Left Arm, Patient Position: Sitting, Cuff Size: Normal)   Pulse (!) 52   Temp 98.1 F (36.7 C) (Oral)   Resp 16   Ht 6' 1.5" (1.867 m)   Wt 223 lb (101.2 kg)   SpO2 99%   BMI 29.02 kg/m   BP Readings from Last 3 Encounters:  03/14/17 110/78  10/04/16 122/60  09/22/15 128/82    Wt Readings from Last 3 Encounters:  03/14/17 223 lb (101.2 kg)  10/04/16 220 lb (99.8 kg)  09/22/15 214 lb (97.1 kg)    Physical Exam  Musculoskeletal:       Right knee: Normal. He exhibits normal range of motion, no swelling, no effusion, no ecchymosis, no deformity, no LCL laxity, normal patellar mobility and no bony tenderness. No tenderness found. No medial joint line tenderness noted.       Left knee: Normal. He exhibits normal range of motion, no swelling, no effusion, no ecchymosis, no deformity, normal alignment and normal patellar mobility. No tenderness found. No medial joint line and no lateral joint line tenderness noted.    Lab Results  Component Value Date   WBC 7.1 10/04/2016   HGB 14.9 10/04/2016   HCT 43.2 10/04/2016   PLT 248.0 10/04/2016   GLUCOSE 90 10/04/2016   CHOL 177 09/22/2015   TRIG 95 09/22/2015   HDL 49 09/22/2015   LDLCALC 109 09/22/2015   ALT 16 09/22/2015   AST 22 09/22/2015   NA 141 10/04/2016   K 4.3 10/04/2016   CL 103 10/04/2016   CREATININE 1.25 10/04/2016   BUN 16 10/04/2016   CO2 28 10/04/2016   TSH 1.370 09/22/2015   Dg Knee Complete 4 Views Left  Result Date: 03/14/2017 CLINICAL DATA:  Pain.  No reported trauma. EXAM: LEFT KNEE - COMPLETE 4+ VIEW COMPARISON:  None. FINDINGS: Minimal degenerative changes in the patellofemoral compartment. No fracture, dislocation, or joint effusion. IMPRESSION: Minimal patellofemoral compartment degenerative changes. Electronically Signed   By: Gerome Sam III M.D   On: 03/14/2017 17:02   Dg Knee Complete 4 Views Right  Result Date: 03/14/2017 CLINICAL DATA:  Pain. EXAM: RIGHT KNEE - COMPLETE 4+ VIEW COMPARISON:  None. FINDINGS: No fracture or dislocation. No joint effusion. An enthesophyte is seen off the superior patella at the quadriceps tendon insertion. The sunrise view is limited due to positioning. IMPRESSION: An enthesophyte is seen off the superior patella at the quadriceps tendon insertion site. No fracture or joint effusion identified. Limited sunrise view. Electronically Signed   By: Gerome Sam III M.D   On: 03/14/2017 17:01    No results found.  Assessment & Plan:   Jeffery was seen today for knee pain.  Diagnoses and all orders for this visit:  Pain in both knees, unspecified  chronicity- exam and x-ray results are consistent with patellar tendinitis with mild degenerative changes and an enthesopathy, will start a long-acting NSAID and physical therapy. -     DG Knee Complete 4 Views Left; Future -     DG Knee Complete 4 Views Right; Future -     etodolac (LODINE XL) 600 MG 24 hr tablet; Take 1 tablet (600 mg total) by mouth daily.  Patellar tendonitis of both knees -     etodolac (LODINE XL) 600 MG 24 hr tablet; Take 1 tablet (600 mg total) by mouth daily. -     Ambulatory referral to Physical Therapy   I am having Mr. Kinnamon start on etodolac.  Meds ordered this encounter  Medications  .  etodolac (LODINE XL) 600 MG 24 hr tablet    Sig: Take 1 tablet (600 mg total) by mouth daily.    Dispense:  90 tablet    Refill:  0     Follow-up: Return in about 2 months (around 05/15/2017).  Sanda Lingerhomas Jones, MD

## 2017-03-14 NOTE — Patient Instructions (Signed)
Patellar Tendinitis  Patellar tendinitis, also called jumper's knee, is inflammation of the patellar tendon. Tendons are cord-like tissues that connect muscles to bones. The patellar tendon connects the bottom of the kneecap (patella) to the top of the shin bone (tibia).  Patellar tendinitis causes pain in the front of the knee. The condition happens in the following stages:   Stage 1. In this stage, there is pain only after activity.   Stage 2: In this stage, you have pain during and after activity.   Stage 3: In this stage, you have pain during and after activity that limits your ability to do the activity.   Stage 4: In this stage, the tendon tears and severely limits your activity.    What are the causes?  This condition is caused by repeated (repetitive) stress on the tendon. This stress may cause the tendon to stretch, swell, thicken, or tear.  What increases the risk?  The following factors make you more likely to develop this condition:   Participating in sports that involve running, kicking and jumping, especially on hard surfaces. These include:  ? Basketball.  ? Volleyball.  ? Soccer.  ? Track and field.   Having tight thigh muscles.   Having received steroid injections in the tendon.   Having had knee surgery.   Being 16-40 years old.   Having rheumatoid arthritis or diabetes.   Training too hard.    What are the signs or symptoms?  The main symptom of this condition is pain in the front of the knee. The pain usually starts slowly then it gradually gets worse. It may become painful to straighten your leg.  How is this diagnosed?  This condition may be diagnosed based on:   Your symptoms.   A medical history.   A physical exam. During the physical exam, your health care provider may check for tenderness in your patella, tightness in your thigh muscles, and pain when you straighten your knee.   Imaging tests, including:  ? X-rays. These will show the position and condition of your  patella.  ? MRI. This will show any tears in your tendon.  ? Ultrasound. This will show any swelling in your tendon and the thickness of your tendon.    How is this treated?  Treatment for this condition depends on the stage of the condition. It may involve:   Avoiding activities that cause pain.   Icing your knee.   Taking an NSAID to reduce pain and swelling.   Doing stretching and strengthening exercises (physical therapy) when pain and swelling improve.   Having sound wave stimulation to promote healing.   Wearing a knee brace. This may be needed if your condition does not improve with treatment.   Using crutches or a walker. This may be needed if your condition does not improve with treatment.   Surgery. This may be done if you have stage 4 tendinitis.    Follow these instructions at home:  If You Have a Knee Brace:   Wear it as told by your health care provider. Remove it only as told by your health care provider.   Loosen the brace if your toes become numb and tingle, or if they turn cold and blue.   Do not let your brace get wet if it is not waterproof.   Keep the brace clean.   Ask your health care provider when it is safe for you to drive.  Managing pain, stiffness, and swelling     Take over-the-counter and prescription medicines only as told by your health care provider.   If directed, apply ice to the injured area.  ? Put ice in a plastic bag.  ? Place a towel between your skin and the bag.  ? Leave the ice on for 20 minutes, 2-3 times a day.   Move your toes often to avoid stiffness and to lessen swelling.   Raise (elevate) your knee above the level of your heart while you are sitting or lying down.  Activity   Return to your normal activities as told by your health care provider. Ask your health care provider what activities are safe for you.   Do exercises as told by your health care provider.  General instructions   Do not use the injured limb to support your body weight until your  health care provider says that you can. Use your crutches or walker as told by your health care provider.   Keep all follow-up visits as told by your health care provider. This is important.  How is this prevented?   Warm up and stretch before being active.   Cool down and stretch after being active.   Give your body time to rest between periods of activity.   Make sure to use equipment that fits you.   Be safe and responsible while being active to avoid falls.   Do at least 150 minutes of moderate-intensity exercise each week, such as brisk walking or water aerobics.   Maintain physical fitness, including:  ? Strength.  ? Flexibility.  ? Cardiovascular fitness.  ? Endurance.  Contact a health care provider if:   Your symptoms have not improve in 6 weeks.   Your symptoms get worse.  This information is not intended to replace advice given to you by your health care provider. Make sure you discuss any questions you have with your health care provider.  Document Released: 08/20/2005 Document Revised: 04/24/2016 Document Reviewed: 05/24/2015  Elsevier Interactive Patient Education  2018 [employer]

## 2017-03-28 ENCOUNTER — Encounter: Payer: Self-pay | Admitting: Physical Therapy

## 2017-03-28 ENCOUNTER — Ambulatory Visit: Payer: BLUE CROSS/BLUE SHIELD | Attending: Internal Medicine | Admitting: Physical Therapy

## 2017-03-28 DIAGNOSIS — M25561 Pain in right knee: Secondary | ICD-10-CM | POA: Diagnosis not present

## 2017-03-28 DIAGNOSIS — G8929 Other chronic pain: Secondary | ICD-10-CM | POA: Diagnosis not present

## 2017-03-28 DIAGNOSIS — M25562 Pain in left knee: Secondary | ICD-10-CM | POA: Diagnosis not present

## 2017-03-28 NOTE — Therapy (Addendum)
[place], [place], [address] Phone: [phone]   Fax:  [phone]  Physical Therapy Treatment/Discharge summary  Patient Details  Name: Jeffery Holt MRN: [id number] Date of Birth: 1986/02/26 Referring Provider: Janith Lima, MD  Encounter Date: 03/28/2017      PT End of Session - 03/28/17 1332    Visit Number 1   Number of Visits 7   Date for PT Re-Evaluation 05/10/17   Authorization Type [employer] 30 visit limit   PT Start Time 1332   PT Stop Time 1425   PT Time Calculation (min) 53 min   Activity Tolerance Patient tolerated treatment well   Behavior During Therapy [facility] for tasks assessed/performed      Past Medical History:  Diagnosis Date  . Allergy   . GERD (gastroesophageal reflux disease)     Past Surgical History:  Procedure Laterality Date  . Tooth implant Left 10/14/2014    There were no vitals filed for this visit.      Subjective Assessment - 03/28/17 1339    Subjective L worse than R. Recongnized as serious issue about a month ago. Used to go away after warming up but now lingers. Superior patellar region. changing shoes/insoles did not help, compression knee brace helps a little. Denies stretching regimen.    How long can you walk comfortably? jog 100 ft when pain begins.    Patient Stated Goals jog, squat. steps/stairs   Currently in Pain? No/denies   Pain Score 0-No pain   Aggravating Factors  jogging   Pain Relieving Factors aleve-for a period of time.             [facility] PT Assessment - 03/28/17 0001      Assessment   Medical Diagnosis bilateral patellar tendonitis   Referring Provider Janith Lima, MD   Onset Date/Surgical Date --  1 month ago   Prior Therapy no     Precautions   Precautions None     Restrictions   Weight Bearing Restrictions No     Balance Screen   Has the patient fallen in the past 6 months No     [place] residence   Additional Comments steps outside house     Prior Function   Level of Independence Independent   Vocation Requirements mix walking/sitting     Cognition   Overall Cognitive Status Within Functional Limits for tasks assessed     Observation/Other Assessments   Focus on Therapeutic Outcomes (FOTO)  36% limited (goal 21%)     Sensation   Additional Comments WFL     ROM / Strength   AROM / PROM / Strength PROM     PROM   Overall PROM Comments HS length L 30 deg, R 50 deg; limited L hip ER v R     Palpation   Palpation comment Denies TTP, notable ITB tightness                     [facility] Adult PT Treatment/Exercise - 03/28/17 0001      Exercises   Exercises Knee/Hip     Knee/Hip Exercises: Stretches   Passive Hamstring Stretch Limitations supine with strap & seated   Quad Stretch Limitations prone & standing   Hip Flexor Stretch Limitations thomas test position with green strap   ITB Stretch Limitations supine with green strap   Gastroc Stretch Limitations runner position  PT Education - 03/28/17 1529    Education provided Yes   Education Details anatomy of condition, POC, HEP, exercise form/rationale, stretch/do other exercises for 1 week and then try running, gradual improvements   Person(s) Educated Patient   Methods Explanation;Demonstration;Tactile cues;Verbal cues;Handout   Comprehension Verbalized understanding;Returned demonstration;Verbal cues required;Tactile cues required;Need further instruction             PT Long Term Goals - 03/28/17 1525      PT LONG TERM GOAL #1   Title Pt will be able to jog without increase in knee pain   Baseline able run about 100 ft at eval   Time 6   Period Weeks   Status New   Target Date 05/10/17     PT LONG TERM GOAL #2   Title Pt will demo proper squat form without deviating from midline   Baseline hips shift to R due to lack of L hip flexibility   Time 6    Period Weeks   Status New   Target Date 05/10/17     PT LONG TERM GOAL #3   Title FOTO to 21% limitation to indicate significant improvement in functional ability   Baseline 36% limitation at eval   Time 6   Period Weeks   Status New   Target Date 05/10/17     PT LONG TERM GOAL #4   Title Pt will be able to navigate steps/stairs without increase in knee pain   Baseline pain at eval when taking 2 at a time   Time 6   Period Weeks   Status New   Target Date 05/10/17               Plan - 03/28/17 1516    Clinical Impression Statement Pt presents to PT with complaints of bilateral knee pain (L worse than R) that is chronic but increased in intensity and functional limitations about 1 month ago. Pt denies any stretching regimen but is an avid jogger, biker etc. Limited flexibility in hips and along LE biomechanical chain leading to poor mechanics in running. Pt also c/o R plantar fasica discomfort. I recommended visiting [facility] for running evaluation to pair with the appropraite shoe wear. Pt is unsure of insurance charges and is going to call them prior to next appointment. pt will benefit from skilled PT to improve flexibility and mechanics to return to PLOF without limitation by knee pain.    History and Personal Factors relevant to plan of care: none   Clinical Presentation Stable   Clinical Presentation due to: n/a   Clinical Decision Making Low   Rehab Potential Good   PT Frequency 1x / week   PT Duration 6 weeks   PT Treatment/Interventions ADLs/Self Care Home Management;Cryotherapy;Electrical Stimulation;Functional mobility training;Stair training;Gait training;Ultrasound;Traction;Moist Heat;Therapeutic activities;Therapeutic exercise;Balance training;Neuromuscular re-education;Patient/family education;Passive range of motion;Manual techniques;Dry needling;Taping;Vasopneumatic Device   PT Next Visit Plan flexibility, evaluate running mechanics (if pt brings shoes),  challenge squat form, check plyometric control   PT Home Exercise Plan hamstring stretch, quad stretch, ITB stretch, gastroc stretch;    Consulted and Agree with Plan of Care Patient      Patient will benefit from skilled therapeutic intervention in order to improve the following deficits and impairments:  Decreased range of motion, Increased muscle spasms, Decreased activity tolerance, Pain, Impaired flexibility, Decreased balance  Visit Diagnosis: Chronic pain of left knee  Chronic pain of right knee     Problem List Patient Active Problem List  Diagnosis Date Noted  . Pain in both knees 03/14/2017  . Patellar tendonitis of both knees 03/14/2017  . Low libido 09/21/2014  . Allergic rhinitis 07/12/2014  . GERD (gastroesophageal reflux disease) 01/23/2013    Geronimo Diliberto C. Aamori Mcmasters PT, DPT 03/28/17 3:32 PM   [facility] [address] [place], [place], [address] Phone: [phone]   Fax:  [phone]  Name: Jeffery Holt MRN: [id number] Date of Birth: December 08, 1985   PHYSICAL THERAPY DISCHARGE SUMMARY  Visits from Start of Care: 1  Current functional level related to goals / functional outcomes: See above   Remaining deficits: See above   Education / Equipment: Anatomy of condition, POC, HEP, exercise form/rationale  Plan: Patient agrees to discharge.  Patient goals were not met. Patient is being discharged due to not returning since the last visit.  ?????    Annete Ayuso C. Benancio Osmundson PT, DPT 05/08/17 11:33 AM

## 2017-07-15 ENCOUNTER — Encounter: Payer: Self-pay | Admitting: Family Medicine

## 2017-07-15 ENCOUNTER — Other Ambulatory Visit (INDEPENDENT_AMBULATORY_CARE_PROVIDER_SITE_OTHER): Payer: BLUE CROSS/BLUE SHIELD

## 2017-07-15 ENCOUNTER — Ambulatory Visit: Payer: BLUE CROSS/BLUE SHIELD | Admitting: Family Medicine

## 2017-07-15 VITALS — BP 122/68 | HR 64 | Temp 98.1°F | Ht 73.5 in | Wt 223.0 lb

## 2017-07-15 DIAGNOSIS — R1012 Left upper quadrant pain: Secondary | ICD-10-CM | POA: Diagnosis not present

## 2017-07-15 LAB — COMPREHENSIVE METABOLIC PANEL
ALBUMIN: 4.9 g/dL (ref 3.5–5.2)
ALK PHOS: 40 U/L (ref 39–117)
ALT: 14 U/L (ref 0–53)
AST: 15 U/L (ref 0–37)
BILIRUBIN TOTAL: 0.8 mg/dL (ref 0.2–1.2)
BUN: 11 mg/dL (ref 6–23)
CO2: 30 mEq/L (ref 19–32)
CREATININE: 1.15 mg/dL (ref 0.40–1.50)
Calcium: 9.6 mg/dL (ref 8.4–10.5)
Chloride: 102 mEq/L (ref 96–112)
GFR: 77.61 mL/min (ref >60.00)
GLUCOSE: 97 mg/dL (ref 70–99)
POTASSIUM: 4.1 meq/L (ref 3.5–5.1)
SODIUM: 140 meq/L (ref 135–145)
TOTAL PROTEIN: 7.2 g/dL (ref 6.0–8.3)

## 2017-07-15 LAB — CBC WITH DIFFERENTIAL/PLATELET
BASOS ABS: 0 10*3/uL (ref 0.0–0.1)
Basophils Relative: 0.3 % (ref 0.0–3.0)
EOS ABS: 0.1 10*3/uL (ref 0.0–0.7)
Eosinophils Relative: 1.3 % (ref 0.0–5.0)
HCT: 45 % (ref 39.0–52.0)
HEMOGLOBIN: 15.3 g/dL (ref 13.0–17.0)
LYMPHS ABS: 1.7 10*3/uL (ref 0.7–4.0)
Lymphocytes Relative: 28.3 % (ref 12.0–46.0)
MCHC: 34 g/dL (ref 30.0–36.0)
MCV: 94 fl (ref 78.0–100.0)
MONO ABS: 0.4 10*3/uL (ref 0.1–1.0)
Monocytes Relative: 6.2 % (ref 3.0–12.0)
NEUTROS PCT: 63.9 % (ref 43.0–77.0)
Neutro Abs: 3.8 10*3/uL (ref 1.4–7.7)
Platelets: 271 10*3/uL (ref 150.0–400.0)
RBC: 4.79 Mil/uL (ref 4.22–5.81)
RDW: 13.3 % (ref 11.5–15.5)
WBC: 5.9 10*3/uL (ref 4.0–10.5)

## 2017-07-15 LAB — AMYLASE: AMYLASE: 63 U/L (ref 27–131)

## 2017-07-15 LAB — SEDIMENTATION RATE: SED RATE: 1 mm/h (ref 0–15)

## 2017-07-15 LAB — LIPASE: LIPASE: 32 U/L (ref 11.0–59.0)

## 2017-07-15 MED ORDER — PANTOPRAZOLE SODIUM 40 MG PO TBEC: 40.0000 mg | DELAYED_RELEASE_TABLET | Freq: Every day | ORAL | 0 refills | Status: DC

## 2017-07-15 NOTE — Patient Instructions (Signed)
Thank you for coming in,   We will call you with the results from today.   Please do not start taking the medication until you are able to provide the stool sample.   Please feel free to call with any questions or concerns at any time, at [phone]. --Dr. Jordan LikesSchmitz

## 2017-07-15 NOTE — Progress Notes (Signed)
Italyhad Godby - 31 y.o. male MRN [id number]  Date of birth: 1986-04-01  SUBJECTIVE:  Including CC & ROS.  Chief Complaint  Patient presents with  . Abdominal Pain    Pain is located left upper quadrant. The pain started on 07/11/17, describes it as a dull ache. He has acid reflux and indgestion. Denies vomiting,     Mr. Faustino CongressCamp is a 31 y.o. male that is presenting with left upper quadrant abdominal pain.  The pain is been present for about a week.  The pain is rated as a 2 out of 10.  The pain is a dull ache in nature.  The pain is improved if he eats food.  Feels early satiety.  He has had one episode of a dark stool.  This resolved and has not had any further bowel movements like this.  Feels that he has fast transit when he does eat certain foods.  Denies any family history of inflammatory bowel disease or autoimmune. Does not take NSAIDS on a regular basis. Pain is localized in nature.  Denies any fevers or chills.   Review of Systems  Constitutional: Negative for fever.  Gastrointestinal: Positive for abdominal pain. Negative for constipation, diarrhea, nausea and vomiting.    HISTORY: Past Medical, Surgical, Social, and Family History Reviewed & Updated per EMR.   Pertinent Historical Findings include:  Past Medical History:  Diagnosis Date  . Allergy   . GERD (gastroesophageal reflux disease)     Past Surgical History:  Procedure Laterality Date  . Tooth implant Left 10/14/2014    No Known Allergies  Family History  Problem Relation Age of Onset  . Alcohol abuse Neg Hx   . Cancer Neg Hx   . COPD Neg Hx   . Depression Neg Hx   . Diabetes Neg Hx   . Drug abuse Neg Hx   . Early death Neg Hx   . Hearing loss Neg Hx   . Heart disease Neg Hx   . Hyperlipidemia Neg Hx   . Hypertension Neg Hx   . Kidney disease Neg Hx   . Stroke Neg Hx   . Kidney Stones Father      Social History   Socioeconomic History  . Marital status: Married    Spouse name: Not on file  . Number  of children: Not on file  . Years of education: Not on file  . Highest education level: Not on file  Social Needs  . Financial resource strain: Not on file  . Food insecurity - worry: Not on file  . Food insecurity - inability: Not on file  . Transportation needs - medical: Not on file  . Transportation needs - non-medical: Not on file  Occupational History  . Not on file  Tobacco Use  . Smoking status: Former Smoker    Last attempt to quit: 01/23/2006    Years since quitting: 11.4  . Smokeless tobacco: Never Used  Substance and Sexual Activity  . Alcohol use: No    Alcohol/week: 12.0 oz    Types: 20 Shots of liquor per week  . Drug use: No  . Sexual activity: Yes    Birth control/protection: Spermicide  Other Topics Concern  . Not on file  Social History Narrative  . Not on file     PHYSICAL EXAM:  VS: BP 122/68 (BP Location: Left Arm, Patient Position: Sitting, Cuff Size: Normal)   Pulse 64   Temp 98.1 F (36.7 C) (Oral)   Ht  6' 1.5" (1.867 m)   Wt 223 lb (101.2 kg)   SpO2 99%   BMI 29.02 kg/m  Physical Exam Gen: NAD, alert, cooperative with exam, well-appearing ENT: normal lips, normal nasal mucosa,  Eye: normal EOM, normal conjunctiva and lids CV:  no edema, +2 pedal pulses, regular rate and rhythm, S1-S2   Resp: no accessory muscle use, non-labored, clear to auscultation bilaterally, no crackles or wheezes GI: no masses or tenderness, no hernia, soft, and nondistended  Skin: no rashes, no areas of induration  Neuro: normal tone, normal sensation to touch Psych:  normal insight, alert and oriented MSK: normal gait, normal strength     ASSESSMENT & PLAN:   Left upper quadrant pain Pain seems to be consistent with an ulcer formation. Concern with bleeding with a stool that was black in nature. No significant NSAID use. No overt alcohol or tobacco use. Has not left the country. - CBC, CMP, sedimentation rate, amylase, lipase, H. pylori stool antigen -  Protonix initiated. - If results are inconclusive could consider referral to GI for EGD versus CT abdomen pelvis.

## 2017-07-15 NOTE — Assessment & Plan Note (Signed)
Pain seems to be consistent with an ulcer formation. Concern with bleeding with a stool that was black in nature. No significant NSAID use. No overt alcohol or tobacco use. Has not left the country. - CBC, CMP, sedimentation rate, amylase, lipase, H. pylori stool antigen - Protonix initiated. - If results are inconclusive could consider referral to GI for EGD versus CT abdomen pelvis.

## 2017-07-16 ENCOUNTER — Telehealth: Payer: Self-pay | Admitting: *Deleted

## 2017-07-16 ENCOUNTER — Other Ambulatory Visit: Payer: BLUE CROSS/BLUE SHIELD

## 2017-07-16 DIAGNOSIS — R1012 Left upper quadrant pain: Secondary | ICD-10-CM

## 2017-07-16 NOTE — Telephone Encounter (Signed)
Pt called in and has some questions about the stool sample and about the meds that Jordan LikesSchmitz gave him Can you call him when you get a chance?

## 2017-07-16 NOTE — Telephone Encounter (Signed)
Patient advised as instructed per Dr. Jordan LikesSchmitz.   Please inform that his lab work is all normal so far. We'll see what the stool study results.

## 2017-07-16 NOTE — Telephone Encounter (Signed)
Contacted patient informed him once we know stool study results we will inform of results. He still has not started taking Protonix yet but will start today.

## 2017-07-17 LAB — HELICOBACTER PYLORI  SPECIAL ANTIGEN
MICRO NUMBER:: 81280853
SPECIMEN QUALITY: ADEQUATE

## 2017-07-19 ENCOUNTER — Encounter: Payer: Self-pay | Admitting: Family Medicine

## 2017-07-31 ENCOUNTER — Encounter: Payer: Self-pay | Admitting: Family Medicine

## 2017-07-31 DIAGNOSIS — R1012 Left upper quadrant pain: Secondary | ICD-10-CM

## 2017-08-05 ENCOUNTER — Encounter: Payer: Self-pay | Admitting: Gastroenterology

## 2017-08-16 ENCOUNTER — Encounter: Payer: Self-pay | Admitting: Gastroenterology

## 2017-08-16 ENCOUNTER — Ambulatory Visit: Payer: BLUE CROSS/BLUE SHIELD | Admitting: Gastroenterology

## 2017-08-16 VITALS — BP 110/70 | HR 74 | Ht 73.5 in | Wt 224.0 lb

## 2017-08-16 DIAGNOSIS — R1012 Left upper quadrant pain: Secondary | ICD-10-CM

## 2017-08-16 DIAGNOSIS — R1013 Epigastric pain: Secondary | ICD-10-CM | POA: Diagnosis not present

## 2017-08-16 NOTE — Patient Instructions (Addendum)
You will be set up for a CT scan of abdomen and pelvis with IV and oral contrast for LUQ pain, bulging.  You have been scheduled for a CT scan of the abdomen and pelvis at [facility] ([address]---this is in the same building as Press [facility]).   You are scheduled on 08/21/17 at 945 am. You should arrive 15 minutes prior to your appointment time for registration. Please follow the written instructions below on the day of your exam:  WARNING: IF YOU ARE ALLERGIC TO IODINE/X-RAY DYE, PLEASE NOTIFY RADIOLOGY IMMEDIATELY AT [phone]! YOU WILL BE GIVEN A 13 HOUR PREMEDICATION PREP.  1) Do not eat or drink anything after 545 am (4 hours prior to your test) 2) You have been given 2 bottles of oral contrast to drink. The solution may taste better if refrigerated, but do NOT add ice or any other liquid to this solution. Shake well before drinking.    Drink 1 bottle of contrast @ 745 am (2 hours prior to your exam)  Drink 1 bottle of contrast @ 845 am (1 hour prior to your exam)  You may take any medications as prescribed with a small amount of water except for the following: Metformin, Glucophage, Glucovance, Avandamet, Riomet, Fortamet, Actoplus Met, Janumet, Glumetza or Metaglip. The above medications must be held the day of the exam AND 48 hours after the exam.  The purpose of you drinking the oral contrast is to aid in the visualization of your intestinal tract. The contrast solution may cause some diarrhea. Before your exam is started, you will be given a small amount of fluid to drink. Depending on your individual set of symptoms, you may also receive an intravenous injection of x-ray contrast/dye. Plan on being at [facility] for 30 minutes or longer, depending on the type of exam you are having performed.  This test typically takes 30-45 minutes to complete.  If you have any questions regarding your exam or if you need to reschedule, you may call the CT  department at [phone] between the hours of 8:00 am and 5:00 pm, Monday-Friday.  ________________________________________________________________________  Dennis Bast will be set up for an upper endoscopy for LUQ pain, dark stool, dyspepsia.  Stop pantoprazole.  Start omeprazole OTC 68m pill, one pill 20-30 min prior to BF meal daily.  Normal BMI (Body Mass Index- based on height and weight) is between 19 and 25. Your BMI today is Body mass index is 29.15 kg/m. .Marland KitchenPlease consider follow up  regarding your BMI with your Primary Care Provider.

## 2017-08-16 NOTE — Progress Notes (Signed)
HPI: This is a very pleasant 31 year old man who was referred to me by Myra RudeSchmitz, Jeremy E, MD  to evaluate left upper quadrant pain.    Chief complaint is left upper quadrant pain  2 months ago, awoke light headed with a left upper quadrant pain.  The pain was a stabbing pain.  Lasted all day.  Slowly improved over the next day or 2 but he still has a discomfort in that region.  No nausea or vomiting but he did have some lightheadedness at the same time and that day he had a particularly dark looking stool.  He has had indigestion since this occurred.  His primary care physician did a battery of blood tests and also stool test, see those results below.  He was eventually put on proton pump inhibitor once daily and this has helped with the indigestion but he still has some soreness in his left upper quadrant and feels he is asymmetric in that region  Torn abd muscle 3-4 years ago this was in the same region, the left upper quadrant.,  It was diagnosed and proven by ultrasound with Dr. Katrinka BlazingSmith.  This was around the time of a weightlifting injury.   Old Data Reviewed: Blood work November 2018: CBC, complete metabolic profile, amylase, lipase and H. pylori stool testing were all normal      Review of systems: Pertinent positive and negative review of systems were noted in the above HPI section. All other review negative.   Past Medical History:  Diagnosis Date  . Allergy   . GERD (gastroesophageal reflux disease)     Past Surgical History:  Procedure Laterality Date  . Tooth implant Left 10/14/2014    Current Outpatient Medications  Medication Sig Dispense Refill  . pantoprazole (PROTONIX) 40 MG tablet Take 1 tablet (40 mg total) daily by mouth. 30 tablet 0   No current facility-administered medications for this visit.     Allergies as of 08/16/2017  . (No Known Allergies)    Family History  Problem Relation Age of Onset  . Kidney Stones Father   . Alcohol abuse Neg Hx   .  Cancer Neg Hx   . COPD Neg Hx   . Depression Neg Hx   . Diabetes Neg Hx   . Drug abuse Neg Hx   . Early death Neg Hx   . Hearing loss Neg Hx   . Heart disease Neg Hx   . Hyperlipidemia Neg Hx   . Hypertension Neg Hx   . Kidney disease Neg Hx   . Stroke Neg Hx   . Colon cancer Neg Hx     Social History   Socioeconomic History  . Marital status: Married    Spouse name: Not on file  . Number of children: 0  . Years of education: Not on file  . Highest education level: Not on file  Social Needs  . Financial resource strain: Not on file  . Food insecurity - worry: Not on file  . Food insecurity - inability: Not on file  . Transportation needs - medical: Not on file  . Transportation needs - non-medical: Not on file  Occupational History  . Occupation: Voc. Ed Cordinator  Tobacco Use  . Smoking status: Former Smoker    Last attempt to quit: 01/23/2006    Years since quitting: 11.5  . Smokeless tobacco: Never Used  Substance and Sexual Activity  . Alcohol use: Yes    Alcohol/week: 12.0 oz    Types:  20 Shots of liquor per week    Comment: social  . Drug use: No  . Sexual activity: Yes    Birth control/protection: Spermicide  Other Topics Concern  . Not on file  Social History Narrative  . Not on file     Physical Exam: BP 110/70   Pulse 74   Ht 6' 1.5" (1.867 m)   Wt 224 lb (101.6 kg)   BMI 29.15 kg/m  Constitutional: generally well-appearing Psychiatric: alert and oriented x3 Eyes: extraocular movements intact Mouth: oral pharynx moist, no lesions Neck: supple no lymphadenopathy Cardiovascular: heart regular rate and rhythm Lungs: clear to auscultation bilaterally Abdomen: soft, nontender, ; mild very slight bulging in the left upper quadrant without any detectable abdominal wall hernia.  No obvious masses.  Nondistended, no obvious ascites, no peritoneal signs, normal bowel sounds Extremities: no lower extremity edema bilaterally Skin: no lesions on visible  extremities   Assessment and plan: 31 y.o. male with left upper quadrant pain, left abdominal bulge, indigestion and dark stool  I think it is extremely unlikely that he has underlying neoplasm as a cause here but that is obviously a concern for him.  He has done some Internet searching I think he has been a bit anxious about it.  I recommended a CT scan abdomen and pelvis for the bulging in the left upper quadrant pain.  I also recommended an upper endoscopy for his dark stools, perhaps he has a mild underlying ulcer.  He does not take NSAIDs and is H. pylori negative by stool testing as above.  He will continue proton pump inhibitor for now since that has generally helped his indigestion symptom.  I recommended a trial of an over-the-counter strength agent partly in a way to wean him from the medicine as well.    Please see the "Patient Instructions" section for addition details about the plan.   Rob Buntinganiel Jacobs, MD [facility] 08/16/2017, 10:43 AM  Cc: Myra RudeSchmitz, Jeremy E, MD

## 2017-08-20 ENCOUNTER — Ambulatory Visit (AMBULATORY_SURGERY_CENTER): Payer: BLUE CROSS/BLUE SHIELD | Admitting: Gastroenterology

## 2017-08-20 ENCOUNTER — Other Ambulatory Visit: Payer: Self-pay

## 2017-08-20 ENCOUNTER — Encounter: Payer: Self-pay | Admitting: Gastroenterology

## 2017-08-20 VITALS — BP 110/70 | HR 85 | Temp 97.8°F | Resp 18 | Ht 73.0 in | Wt 224.0 lb

## 2017-08-20 DIAGNOSIS — K2970 Gastritis, unspecified, without bleeding: Secondary | ICD-10-CM

## 2017-08-20 DIAGNOSIS — K2900 Acute gastritis without bleeding: Secondary | ICD-10-CM

## 2017-08-20 DIAGNOSIS — R1012 Left upper quadrant pain: Secondary | ICD-10-CM | POA: Diagnosis not present

## 2017-08-20 DIAGNOSIS — K2950 Unspecified chronic gastritis without bleeding: Secondary | ICD-10-CM | POA: Diagnosis not present

## 2017-08-20 DIAGNOSIS — K219 Gastro-esophageal reflux disease without esophagitis: Secondary | ICD-10-CM | POA: Diagnosis not present

## 2017-08-20 MED ORDER — SODIUM CHLORIDE 0.9 % IV SOLN: 500.0000 mL | Freq: Once | INTRAVENOUS | Status: DC

## 2017-08-20 NOTE — Op Note (Signed)
[facility] Patient Name: Jeffery Holt Procedure Date: 08/20/2017 1:25 PM MRN: [id number] Endoscopist: Rachael Fee , MD Age: 31 Referring MD:  Date of Birth: 07/14/1986 Gender: Male Account #: [id number] Procedure:                Upper GI endoscopy Indications:              Abdominal pain in the left upper quadrant Medicines:                Monitored Anesthesia Care Procedure:                Pre-Anesthesia Assessment:                           - Prior to the procedure, a History and Physical                            was performed, and patient medications and                            allergies were reviewed. The patient's tolerance of                            previous anesthesia was also reviewed. The risks                            and benefits of the procedure and the sedation                            options and risks were discussed with the patient.                            All questions were answered, and informed consent                            was obtained. Prior Anticoagulants: The patient has                            taken no previous anticoagulant or antiplatelet                            agents. ASA Grade Assessment: II - A patient with                            mild systemic disease. After reviewing the risks                            and benefits, the patient was deemed in                            satisfactory condition to undergo the procedure.                           After obtaining informed consent, the endoscope was  passed under direct vision. Throughout the                            procedure, the patient's blood pressure, pulse, and                            oxygen saturations were monitored continuously. The                            Endoscope was introduced through the mouth, and                            advanced to the second part of duodenum. The upper                            GI endoscopy was  accomplished without difficulty.                            The patient tolerated the procedure well. Scope In: Scope Out: Findings:                 The esophagus was normal.                           Minimal inflammation characterized by erythema was                            found in the gastric antrum. Biopsies were taken                            with a cold forceps for histology.                           The examined duodenum was normal. Complications:            No immediate complications. Estimated blood loss:                            None. Estimated Blood Loss:     Estimated blood loss: none. Impression:               - Normal esophagus.                           - Mild gastritis, biopsied.                           - Normal examined duodenum. Recommendation:           - Patient has a contact number available for                            emergencies. The signs and symptoms of potential                            delayed complications were discussed with the  patient. Return to normal activities tomorrow.                            Written discharge instructions were provided to the                            patient.                           - Resume previous diet.                           - Continue present medications.                           - Await pathology results and results from CT scan                            tomorrow. Rachael Feeaniel P Wilma Michaelson, MD 08/20/2017 1:35:17 PM This report has been signed electronically.

## 2017-08-20 NOTE — Progress Notes (Signed)
Pt's states no medical or surgical changes since previsit or office visit. 

## 2017-08-20 NOTE — Progress Notes (Signed)
Report to PACU, RN, vss, BBS= Clear.  

## 2017-08-20 NOTE — Progress Notes (Signed)
Called to room to assist during endoscopic procedure.  Patient ID and intended procedure confirmed with present staff. Received instructions for my participation in the procedure from the performing physician.  

## 2017-08-20 NOTE — Patient Instructions (Addendum)
YOU HAD AN ENDOSCOPIC PROCEDURE TODAY AT THE [facility]:   Refer to the procedure report that was given to you for any specific questions about what was found during the examination.  If the procedure report does not answer your questions, please call your gastroenterologist to clarify.  If you requested that your care partner not be given the details of your procedure findings, then the procedure report has been included in a sealed envelope for you to review at your convenience later.  YOU SHOULD EXPECT: Some feelings of bloating in the abdomen. Passage of more gas than usual.  Walking can help get rid of the air that was put into your GI tract during the procedure and reduce the bloating. If you had a lower endoscopy (such as a colonoscopy or flexible sigmoidoscopy) you may notice spotting of blood in your stool or on the toilet paper. If you underwent a bowel prep for your procedure, you may not have a normal bowel movement for a few days.  Please Note:  You might notice some irritation and congestion in your nose or some drainage.  This is from the oxygen used during your procedure.  There is no need for concern and it should clear up in a day or so.  SYMPTOMS TO REPORT IMMEDIATELY:   Following upper endoscopy (EGD)  Vomiting of blood or coffee ground material  New chest pain or pain under the shoulder blades  Painful or persistently difficult swallowing  New shortness of breath  Fever of 100F or higher  Black, tarry-looking stools  For urgent or emergent issues, a gastroenterologist can be reached at any hour by calling [phone].   DIET:  We do recommend a small meal at first, but then you may proceed to your regular diet.  Drink plenty of fluids but you should avoid alcoholic beverages for 24 hours.  ACTIVITY:  You should plan to take it easy for the rest of today and you should NOT DRIVE or use heavy machinery until tomorrow (because of the sedation medicines used  during the test).    FOLLOW UP: Our staff will call the number listed on your records the next business day following your procedure to check on you and address any questions or concerns that you may have regarding the information given to you following your procedure. If we do not reach you, we will leave a message.  However, if you are feeling well and you are not experiencing any problems, there is no need to return our call.  We will assume that you have returned to your regular daily activities without incident.  If any biopsies were taken you will be contacted by phone or by letter within the next 1-3 weeks.  Please call us at [phone] if you have not heard about the biopsies in 3 weeks.   Await for biopsy results and results of CT scan tomorrow Gastritis (handout given)  SIGNATURES/CONFIDENTIALITY: You and/or your care partner have signed paperwork which will be entered into your electronic medical record.  These signatures attest to the fact that that the information above on your After Visit Summary has been reviewed and is understood.  Full responsibility of the confidentiality of this discharge information lies with you and/or your care-partner.

## 2017-08-21 ENCOUNTER — Ambulatory Visit (INDEPENDENT_AMBULATORY_CARE_PROVIDER_SITE_OTHER)
Admission: RE | Admit: 2017-08-21 | Discharge: 2017-08-21 | Disposition: A | Discharge status: Home / Self Care | Payer: BLUE CROSS/BLUE SHIELD | Source: Ambulatory Visit | Attending: Gastroenterology | Admitting: Gastroenterology

## 2017-08-21 ENCOUNTER — Encounter: Payer: Self-pay | Admitting: Gastroenterology

## 2017-08-21 ENCOUNTER — Telehealth: Payer: Self-pay

## 2017-08-21 DIAGNOSIS — R1013 Epigastric pain: Secondary | ICD-10-CM

## 2017-08-21 DIAGNOSIS — R1012 Left upper quadrant pain: Secondary | ICD-10-CM

## 2017-08-21 DIAGNOSIS — R10812 Left upper quadrant abdominal tenderness: Secondary | ICD-10-CM | POA: Diagnosis not present

## 2017-08-21 MED ORDER — IOPAMIDOL (ISOVUE-300) INJECTION 61%
100.0000 mL | Freq: Once | INTRAVENOUS | Status: AC | PRN
  Administered 2017-08-21: 100 mL via INTRAVENOUS

## 2017-08-21 NOTE — Telephone Encounter (Signed)
  Follow up Call-  Call back number 08/20/2017  Post procedure Call Back phone  # [phone]  Permission to leave phone message Yes  Some recent data might be hidden     Patient questions:  Do you have a fever, pain , or abdominal swelling? No. Pain Score  0 *  Have you tolerated food without any problems? Yes.    Have you been able to return to your normal activities? Yes.    Do you have any questions about your discharge instructions: Diet   No. Medications  No. Follow up visit  No.  Do you have questions or concerns about your Care? No.  Actions: * If pain score is 4 or above: No action needed, pain <4.

## 2017-08-28 ENCOUNTER — Encounter: Payer: Self-pay | Admitting: Gastroenterology

## 2017-08-29 ENCOUNTER — Encounter: Payer: Self-pay | Admitting: Gastroenterology

## 2017-11-03 ENCOUNTER — Encounter: Payer: Self-pay | Admitting: Family Medicine

## 2018-07-07 NOTE — Progress Notes (Signed)
Subjective:    Patient ID: Jeffery Holt, male    DOB: 10-25-1985, 32 y.o.   MRN: [id number]  HPI He is here for an acute visit for swollen glands in his neck.    His symptoms started 1 week ago.    He had a sore throat several days ago and thought he was coming down with something.  He has been experiencing intermittent difficulty swallowing like the throat was slightly swollen.  He denies any difficulty swallowing food or liquid in particular.  He has mild, chronic nasal congestion that is unchanged.  Most concerning and why he is here today is he has a swollen, tender left neck.  He denies any other swollen glands or lumps in his neck.  He was unsure if the swollen went was related to an infection or something else concerning.  He does have 2 family members that had lymphoma.  He denies any fevers, chills, change in appetite or weight change.  He denies any ear pain, sinus pain, coughing, wheezing or shortness of breath.  He denies headaches.  Few days ago he did have mild left lower tooth pain, but that has resolved.  He does see his dentist regularly.    Medications and allergies reviewed with patient and updated if appropriate.  Patient Active Problem List   Diagnosis Date Noted  . Left upper quadrant pain 07/15/2017  . Pain in both knees 03/14/2017  . Patellar tendonitis of both knees 03/14/2017  . Low libido 09/21/2014  . Allergic rhinitis 07/12/2014  . GERD (gastroesophageal reflux disease) 01/23/2013    Current Outpatient Medications on File Prior to Visit  Medication Sig Dispense Refill  . pantoprazole (PROTONIX) 40 MG tablet Take 1 tablet (40 mg total) daily by mouth. 30 tablet 0   No current facility-administered medications on file prior to visit.     Past Medical History:  Diagnosis Date  . Allergy   . GERD (gastroesophageal reflux disease)     Past Surgical History:  Procedure Laterality Date  . Tooth implant Left 10/14/2014    Social History    Socioeconomic History  . Marital status: Married    Spouse name: Not on file  . Number of children: 0  . Years of education: Not on file  . Highest education level: Not on file  Occupational History  . Occupation: Voc. Ed Cordinator  Social Needs  . Financial resource strain: Not on file  . Food insecurity:    Worry: Not on file    Inability: Not on file  . Transportation needs:    Medical: Not on file    Non-medical: Not on file  Tobacco Use  . Smoking status: Former Smoker    Last attempt to quit: 01/23/2006    Years since quitting: 12.4  . Smokeless tobacco: Never Used  Substance and Sexual Activity  . Alcohol use: Yes    Alcohol/week: 20.0 standard drinks    Types: 20 Shots of liquor per week    Comment: social  . Drug use: No  . Sexual activity: Yes    Birth control/protection: Spermicide  Lifestyle  . Physical activity:    Days per week: Not on file    Minutes per session: Not on file  . Stress: Not on file  Relationships  . Social connections:    Talks on phone: Not on file    Gets together: Not on file    Attends religious service: Not on file    Active member  of club or organization: Not on file    Attends meetings of clubs or organizations: Not on file    Relationship status: Not on file  Other Topics Concern  . Not on file  Social History Narrative  . Not on file    Family History  Problem Relation Age of Onset  . Kidney Stones Father   . Alcohol abuse Neg Hx   . Cancer Neg Hx   . COPD Neg Hx   . Depression Neg Hx   . Diabetes Neg Hx   . Drug abuse Neg Hx   . Early death Neg Hx   . Hearing loss Neg Hx   . Heart disease Neg Hx   . Hyperlipidemia Neg Hx   . Hypertension Neg Hx   . Kidney disease Neg Hx   . Stroke Neg Hx   . Colon cancer Neg Hx     Review of Systems  Constitutional: Negative for appetite change, chills, fever and unexpected weight change.  HENT: Positive for congestion (mild - chronic, no obvious change), sore throat  (initially) and trouble swallowing (nothing specific for 1 week or so). Negative for ear pain, sinus pressure and sinus pain.   Respiratory: Negative for cough, shortness of breath and wheezing.   Gastrointestinal: Negative for abdominal pain, diarrhea and nausea.       GERD a few times a month  Neurological: Negative for dizziness, light-headedness and headaches.       Objective:   Vitals:   07/08/18 0800  BP: 110/78  Pulse: (!) 50  Resp: 16  Temp: 98 F (36.7 C)  SpO2: 99%   BP Readings from Last 3 Encounters:  07/08/18 110/78  08/20/17 110/70  08/16/17 110/70   Wt Readings from Last 3 Encounters:  07/08/18 218 lb 12.8 oz (99.2 kg)  08/20/17 224 lb (101.6 kg)  08/16/17 224 lb (101.6 kg)   Body mass index is 28.87 kg/m.   Physical Exam    GENERAL APPEARANCE: Appears stated age, well appearing, NAD EYES: conjunctiva clear, no icterus HEENT: bilateral tympanic membranes and ear canals normal, oropharynx with no erythema, no thyromegaly, trachea midline, 1 tender, slightly swollen left superior, anterior cervical lymph node, no other palpable or concerning lymphadenopathy (supraclavicular, cervical, mandibular, auricular or occipital) LUNGS: Clear to auscultation without wheeze or crackles, unlabored breathing, good air entry bilaterally CARDIOVASCULAR: Normal S1,S2 without murmurs, no edema SKIN: Warm, dry      Assessment & Plan:    See Problem List for Assessment and Plan of chronic medical problems.

## 2018-07-08 ENCOUNTER — Encounter: Payer: Self-pay | Admitting: Internal Medicine

## 2018-07-08 ENCOUNTER — Other Ambulatory Visit (INDEPENDENT_AMBULATORY_CARE_PROVIDER_SITE_OTHER): Payer: No Typology Code available for payment source

## 2018-07-08 ENCOUNTER — Ambulatory Visit (INDEPENDENT_AMBULATORY_CARE_PROVIDER_SITE_OTHER): Payer: No Typology Code available for payment source | Admitting: Internal Medicine

## 2018-07-08 VITALS — BP 110/78 | HR 50 | Temp 98.0°F | Resp 16 | Ht 73.0 in | Wt 218.8 lb

## 2018-07-08 DIAGNOSIS — R590 Localized enlarged lymph nodes: Secondary | ICD-10-CM

## 2018-07-08 LAB — CBC WITH DIFFERENTIAL/PLATELET
BASOS ABS: 0 10*3/uL (ref 0.0–0.1)
Basophils Relative: 0.3 % (ref 0.0–3.0)
Eosinophils Absolute: 0.2 10*3/uL (ref 0.0–0.7)
Eosinophils Relative: 2.9 % (ref 0.0–5.0)
HEMATOCRIT: 44 % (ref 39.0–52.0)
Hemoglobin: 15.2 g/dL (ref 13.0–17.0)
Lymphocytes Relative: 29.1 % (ref 12.0–46.0)
Lymphs Abs: 1.8 10*3/uL (ref 0.7–4.0)
MCHC: 34.4 g/dL (ref 30.0–36.0)
MCV: 94.2 fl (ref 78.0–100.0)
MONOS PCT: 8.7 % (ref 3.0–12.0)
Monocytes Absolute: 0.6 10*3/uL (ref 0.1–1.0)
NEUTROS PCT: 59 % (ref 43.0–77.0)
Neutro Abs: 3.7 10*3/uL (ref 1.4–7.7)
Platelets: 240 10*3/uL (ref 150.0–400.0)
RBC: 4.67 Mil/uL (ref 4.22–5.81)
RDW: 13.5 % (ref 11.5–15.5)
WBC: 6.3 10*3/uL (ref 4.0–10.5)

## 2018-07-08 NOTE — Patient Instructions (Addendum)
  Tests ordered today. Your results will be released to MyChart (or called to you) after review, usually within 72hours after test completion. If any changes need to be made, you will be notified at that same time.   Medications reviewed and updated.  Changes include :   none   Your lymph node is likely reacting to allergies or a mild infection you are fighting off.  Most likely this will resolve on its own.    Call if no improvement - the lymph node does not go down completely.

## 2018-07-08 NOTE — Assessment & Plan Note (Signed)
1 tender, lightly swollen left anterior cervical lymph node Likely reactive in nature-possibly fighting off an infection No other concerning symptoms or signs on exam Will check CBC given his history of lymphoma-I think this will be reassuring for him Advised him to just monitor for now and call if his swollen lymph node does not resolve on its own or if he develops any other infectious symptoms

## 2018-07-21 ENCOUNTER — Encounter: Payer: Self-pay | Admitting: Family

## 2018-07-21 ENCOUNTER — Ambulatory Visit (INDEPENDENT_AMBULATORY_CARE_PROVIDER_SITE_OTHER): Payer: No Typology Code available for payment source | Admitting: Family

## 2018-07-21 VITALS — BP 114/76 | HR 62 | Temp 97.9°F | Ht 73.0 in | Wt 221.1 lb

## 2018-07-21 DIAGNOSIS — R590 Localized enlarged lymph nodes: Secondary | ICD-10-CM

## 2018-07-21 DIAGNOSIS — H5711 Ocular pain, right eye: Secondary | ICD-10-CM

## 2018-07-21 MED ORDER — TOBRAMYCIN 0.3 % OP SOLN: 1.0000 [drp] | Freq: Four times a day (QID) | OPHTHALMIC | 0 refills | Status: DC

## 2018-07-21 NOTE — Progress Notes (Signed)
It[place]had Faustino CongressCamp is a 32 y.o. male with the following history as recorded in EpicCare:  Patient Active Problem List   Diagnosis Date Noted  . Anterior cervical lymphadenopathy 07/08/2018  . Left upper quadrant pain 07/15/2017  . Pain in both knees 03/14/2017  . Patellar tendonitis of both knees 03/14/2017  . Low libido 09/21/2014  . Allergic rhinitis 07/12/2014  . GERD (gastroesophageal reflux disease) 01/23/2013    Current Outpatient Medications  Medication Sig Dispense Refill  . pantoprazole (PROTONIX) 40 MG tablet Take 1 tablet (40 mg total) daily by mouth. (Patient not taking: Reported on 07/21/2018) 30 tablet 0  . tobramycin (TOBREX) 0.3 % ophthalmic solution Place 1 drop into the right eye every 6 (six) hours. 5 mL 0   No current facility-administered medications for this visit.     Allergies: Patient has no known allergies.  Past Medical History:  Diagnosis Date  . Allergy   . GERD (gastroesophageal reflux disease)     Past Surgical History:  Procedure Laterality Date  . Tooth implant Left 10/14/2014    Family History  Problem Relation Age of Onset  . Kidney Stones Father   . Alcohol abuse Neg Hx   . Cancer Neg Hx   . COPD Neg Hx   . Depression Neg Hx   . Diabetes Neg Hx   . Drug abuse Neg Hx   . Early death Neg Hx   . Hearing loss Neg Hx   . Heart disease Neg Hx   . Hyperlipidemia Neg Hx   . Hypertension Neg Hx   . Kidney disease Neg Hx   . Stroke Neg Hx   . Colon cancer Neg Hx     Social History   Tobacco Use  . Smoking status: Former Smoker    Last attempt to quit: 01/23/2006    Years since quitting: 12.4  . Smokeless tobacco: Never Used  Substance Use Topics  . Alcohol use: Yes    Alcohol/week: 20.0 standard drinks    Types: 20 Shots of liquor per week    Comment: social    Subjective:  Patient presents with concerns for 1 week history of right eye pain/ "light sensitive"; "feels sore"- feels like something could be in the eye; does wood-working- uses  power tool; no drainage consistent with pink eye; has felt better since taking Claritin/ Ibuprofen; feels like his vision is affected- not as clear;   Still very concerned about presence of left sided cervical lymph node; CBC done was normal; thought to be reactive in nature; ? If the lymph node and eye could be related?    Objective:  Vitals:   07/21/18 1609  BP: 114/76  Pulse: 62  Temp: 97.9 F (36.6 C)  TempSrc: Oral  SpO2: 98%  Weight: 221 lb 1.9 oz (100.3 kg)  Height: 6\' 1"  (1.854 m)    General: Well developed, well nourished, in no acute distress  Skin : Warm and dry.  Head: Normocephalic and atraumatic  Eyes: Sclera and conjunctiva erythematous; pupils round and reactive to light; extraocular movements intact  Ears: External normal; canals clear; tympanic membranes normal  Oropharynx: Pink, supple. No suspicious lesions  Neck: Supple without thyromegaly, left sided cervical adenopathy  Lungs: Respirations unlabored; clear to auscultation bilaterally without wheeze, rales, rhonchi  CVS exam: normal rate and regular rhythm.  Neurologic: Alert and oriented; speech intact; face symmetrical; moves all extremities well; CNII-XII intact without focal deficit   Assessment:  1. Pain of right eye  2. Anterior cervical lymphadenopathy     Plan:  1. Based on pain and light sensitivity, am concerned for uveitis or iritis; explained to patient that do not feel he has conjunctivitis and needs to get eye exam done; due to work schedule, he prefers to make his own appointment; call back if unable to see eye doctor in the next 24-48 hours; I did give him Rx for tobramycin if he wants to try in the interim before seeing the eye doctor;  2. Agree that lymph node and eye could be related; once source of eye pain is determined, will know better how to proceed; lymph node has been present x 2 weeks- if persisting, to consider CXR and neck CT; follow-up to be determined.  No follow-ups on file.   Orders Placed This Encounter  Procedures  . Ambulatory referral to Ophthalmology    Referral Priority:   Urgent    Referral Type:   Consultation    Referral Reason:   Specialty Services Required    Requested Specialty:   Ophthalmology    Number of Visits Requested:   1    Requested Prescriptions   Signed Prescriptions Disp Refills  . tobramycin (TOBREX) 0.3 % ophthalmic solution 5 mL 0    Sig: Place 1 drop into the right eye every 6 (six) hours.

## 2018-07-21 NOTE — Patient Instructions (Addendum)
Concerns would be iritis or uveitis; please let the ophthalmologist know this when you call to get an appointment.  [facility] [phone]; [facility] [phone]

## 2018-09-09 DIAGNOSIS — H20011 Primary iridocyclitis, right eye: Secondary | ICD-10-CM | POA: Diagnosis not present

## 2018-09-10 DIAGNOSIS — H30033 Focal chorioretinal inflammation, peripheral, bilateral: Secondary | ICD-10-CM | POA: Diagnosis not present

## 2018-09-12 ENCOUNTER — Ambulatory Visit: Payer: BLUE CROSS/BLUE SHIELD | Admitting: Family

## 2018-09-12 ENCOUNTER — Encounter: Payer: Self-pay | Admitting: Family

## 2018-09-12 ENCOUNTER — Ambulatory Visit (INDEPENDENT_AMBULATORY_CARE_PROVIDER_SITE_OTHER)
Admission: RE | Admit: 2018-09-12 | Discharge: 2018-09-12 | Disposition: A | Discharge status: Home / Self Care | Payer: BLUE CROSS/BLUE SHIELD | Source: Ambulatory Visit | Attending: Family | Admitting: Family

## 2018-09-12 VITALS — BP 112/76 | HR 61 | Temp 97.9°F | Ht 73.0 in | Wt 227.0 lb

## 2018-09-12 DIAGNOSIS — R599 Enlarged lymph nodes, unspecified: Secondary | ICD-10-CM | POA: Diagnosis not present

## 2018-09-12 DIAGNOSIS — R590 Localized enlarged lymph nodes: Secondary | ICD-10-CM | POA: Diagnosis not present

## 2018-09-12 DIAGNOSIS — R221 Localized swelling, mass and lump, neck: Secondary | ICD-10-CM | POA: Diagnosis not present

## 2018-09-12 NOTE — Progress Notes (Signed)
Jeffery Holt is a [age] y.o. male with the following history as recorded in EpicCare:  Patient Active Problem List   Diagnosis Date Noted  . Anterior cervical lymphadenopathy 07/08/2018  . Left upper quadrant pain 07/15/2017  . Pain in both knees 03/14/2017  . Patellar tendonitis of both knees 03/14/2017  . Low libido 09/21/2014  . Allergic rhinitis 07/12/2014  . GERD (gastroesophageal reflux disease) 01/23/2013    Current Outpatient Medications  Medication Sig Dispense Refill  . prednisoLONE acetate (PRED FORTE) 1 % ophthalmic suspension Place 1 drop into both eyes 4 times daily.    . valACYclovir (VALTREX) 1000 MG tablet Take by mouth.     No current facility-administered medications for this visit.     Allergies: Patient has no known allergies.  Past Medical History:  Diagnosis Date  . Allergy   . GERD (gastroesophageal reflux disease)     Past Surgical History:  Procedure Laterality Date  . Tooth implant Left 10/14/2014    Family History  Problem Relation Age of Onset  . Kidney Stones Father   . Alcohol abuse Neg Hx   . Cancer Neg Hx   . COPD Neg Hx   . Depression Neg Hx   . Diabetes Neg Hx   . Drug abuse Neg Hx   . Early death Neg Hx   . Hearing loss Neg Hx   . Heart disease Neg Hx   . Hyperlipidemia Neg Hx   . Hypertension Neg Hx   . Kidney disease Neg Hx   . Stroke Neg Hx   . Colon cancer Neg Hx     Social History   Tobacco Use  . Smoking status: Former Smoker    Last attempt to quit: 01/23/2006    Years since quitting: 12.6  . Smokeless tobacco: Never Used  Substance Use Topics  . Alcohol use: Yes    Alcohol/week: 20.0 standard drinks    Types: 20 Shots of liquor per week    Comment: social    Subjective:  Was seen here in November with concerns for uveitis- symptoms re-flared in December; [facility] in the past few days- extensive labs are pending; concerned about persisting swollen lymph nodes on left side of neck;  concerned that may have underlying cancer/ concerned about what the source of the swollen lymph nodes could be; denies any dizziness, night sweats, nausea or unexplained weight loss; patient has had normal CBC, CMP done with ophthalmology in the past 24 hours;      Objective:  Vitals:   09/12/18 1357  BP: 112/76  Pulse: 61  Temp: 97.9 F (36.6 C)  TempSrc: Oral  SpO2: 97%  Weight: 227 lb (103 kg)  Height: 6\' 1"  (1.854 m)    General: Well developed, well nourished, in no acute distress  Skin : Warm and dry.  Head: Normocephalic and atraumatic  Eyes: Sclera and conjunctiva clear; pupils round and reactive to light; extraocular movements intact  Ears: External normal; canals clear; tympanic membranes normal  Oropharynx: Pink, supple. No suspicious lesions  Neck: Supple without thyromegaly, left sided cervical lymphadenopathy  Lungs: Respirations unlabored; Neurologic: Alert and oriented; speech intact; face symmetrical; moves all extremities well; CNII-XII intact without focal deficit  Assessment:  1. Lymphadenopathy of left cervical region   2. Localized swelling, mass or lump of neck     Plan:  [facility]; labs are pending; need to determine if lymph  nodes are part of the uveitis ( reactive) or related to what underlying issue may be causing the uveitis; will go ahead and update CXR and neck CT; follow-up to be determined.   No follow-ups on file.  Orders Placed This Encounter  Procedures  . DG Chest 2 View    Standing Status:   Future    Number of Occurrences:   1    Standing Expiration Date:   11/11/2019    Order Specific Question:   Reason for Exam (SYMPTOM  OR DIAGNOSIS REQUIRED)    Answer:   lymphadenopathy    Order Specific Question:   Preferred imaging location?    Answer:   [address]    Order Specific Question:   Radiology Contrast Protocol - do NOT remove file path    Answer:    [email or web address]  . CT Soft Tissue Neck W Contrast    Standing Status:   Future    Standing Expiration Date:   12/12/2019    Order Specific Question:   If indicated for the ordered procedure, I authorize the administration of contrast media per Radiology protocol    Answer:   Yes    Order Specific Question:   Preferred imaging location?    Answer:   [place] [facility]    Order Specific Question:   Radiology Contrast Protocol - do NOT remove file path    Answer:   [email or web address]    Requested Prescriptions    No prescriptions requested or ordered in this encounter

## 2018-09-14 ENCOUNTER — Encounter: Payer: Self-pay | Admitting: Family

## 2018-09-17 DIAGNOSIS — H30033 Focal chorioretinal inflammation, peripheral, bilateral: Secondary | ICD-10-CM | POA: Diagnosis not present

## 2018-09-29 ENCOUNTER — Encounter: Payer: Self-pay | Admitting: Family

## 2018-10-01 ENCOUNTER — Other Ambulatory Visit: Payer: Self-pay | Admitting: Family

## 2018-10-01 DIAGNOSIS — R590 Localized enlarged lymph nodes: Secondary | ICD-10-CM

## 2018-10-09 ENCOUNTER — Other Ambulatory Visit: Payer: Self-pay | Admitting: Otolaryngology

## 2018-10-09 DIAGNOSIS — Z7289 Other problems related to lifestyle: Secondary | ICD-10-CM | POA: Diagnosis not present

## 2018-10-09 DIAGNOSIS — R591 Generalized enlarged lymph nodes: Secondary | ICD-10-CM

## 2018-10-09 DIAGNOSIS — R448 Other symptoms and signs involving general sensations and perceptions: Secondary | ICD-10-CM | POA: Diagnosis not present

## 2018-10-13 ENCOUNTER — Ambulatory Visit
Admission: RE | Admit: 2018-10-13 | Discharge: 2018-10-13 | Disposition: A | Discharge status: Home / Self Care | Payer: BLUE CROSS/BLUE SHIELD | Source: Ambulatory Visit | Attending: Otolaryngology | Admitting: Otolaryngology

## 2018-10-13 DIAGNOSIS — R590 Localized enlarged lymph nodes: Secondary | ICD-10-CM | POA: Diagnosis not present

## 2018-10-13 DIAGNOSIS — R591 Generalized enlarged lymph nodes: Secondary | ICD-10-CM

## 2018-10-21 DIAGNOSIS — H30033 Focal chorioretinal inflammation, peripheral, bilateral: Secondary | ICD-10-CM | POA: Diagnosis not present

## 2018-10-22 ENCOUNTER — Other Ambulatory Visit: Payer: Self-pay | Admitting: Otolaryngology

## 2018-10-22 DIAGNOSIS — R590 Localized enlarged lymph nodes: Secondary | ICD-10-CM

## 2019-02-23 ENCOUNTER — Encounter: Payer: Self-pay | Admitting: Internal Medicine

## 2019-04-13 ENCOUNTER — Other Ambulatory Visit: Payer: Self-pay

## 2019-04-13 DIAGNOSIS — Z20822 Contact with and (suspected) exposure to covid-19: Secondary | ICD-10-CM

## 2019-04-14 LAB — NOVEL CORONAVIRUS, NAA: SARS-CoV-2, NAA: NOT DETECTED

## 2019-07-03 ENCOUNTER — Other Ambulatory Visit: Payer: Self-pay

## 2019-07-03 DIAGNOSIS — Z20822 Contact with and (suspected) exposure to covid-19: Secondary | ICD-10-CM

## 2019-07-04 LAB — NOVEL CORONAVIRUS, NAA: SARS-CoV-2, NAA: NOT DETECTED

## 2019-09-11 ENCOUNTER — Ambulatory Visit: Payer: Self-pay | Attending: Internal Medicine

## 2019-09-11 DIAGNOSIS — Z20822 Contact with and (suspected) exposure to covid-19: Secondary | ICD-10-CM

## 2019-09-13 LAB — NOVEL CORONAVIRUS, NAA: SARS-CoV-2, NAA: NOT DETECTED

## 2019-11-17 ENCOUNTER — Other Ambulatory Visit (INDEPENDENT_AMBULATORY_CARE_PROVIDER_SITE_OTHER): Payer: 59

## 2019-11-17 ENCOUNTER — Encounter: Payer: Self-pay | Admitting: Internal Medicine

## 2019-11-17 ENCOUNTER — Ambulatory Visit: Payer: 59 | Admitting: Internal Medicine

## 2019-11-17 ENCOUNTER — Other Ambulatory Visit: Payer: Self-pay

## 2019-11-17 VITALS — BP 118/78 | HR 67 | Temp 98.6°F | Resp 16 | Ht 73.0 in | Wt 228.0 lb

## 2019-11-17 DIAGNOSIS — Z0000 Encounter for general adult medical examination without abnormal findings: Secondary | ICD-10-CM

## 2019-11-17 DIAGNOSIS — R590 Localized enlarged lymph nodes: Secondary | ICD-10-CM | POA: Diagnosis not present

## 2019-11-17 LAB — BASIC METABOLIC PANEL
BUN: 15 mg/dL (ref 6–23)
CO2: 30 mEq/L (ref 19–32)
Calcium: 9 mg/dL (ref 8.4–10.5)
Chloride: 104 mEq/L (ref 96–112)
Creatinine, Ser: 1.06 mg/dL (ref 0.40–1.50)
GFR: 79.13 mL/min (ref >60.00)
Glucose, Bld: 83 mg/dL (ref 70–99)
Potassium: 4.1 mEq/L (ref 3.5–5.1)
Sodium: 139 mEq/L (ref 135–145)

## 2019-11-17 LAB — CBC WITH DIFFERENTIAL/PLATELET
Basophils Absolute: 0 10*3/uL (ref 0.0–0.1)
Basophils Relative: 0.5 % (ref 0.0–3.0)
Eosinophils Absolute: 0.2 10*3/uL (ref 0.0–0.7)
Eosinophils Relative: 3.2 % (ref 0.0–5.0)
HCT: 41.5 % (ref 39.0–52.0)
Hemoglobin: 14.3 g/dL (ref 13.0–17.0)
Lymphocytes Relative: 31 % (ref 12.0–46.0)
Lymphs Abs: 1.9 10*3/uL (ref 0.7–4.0)
MCHC: 34.5 g/dL (ref 30.0–36.0)
MCV: 94.8 fl (ref 78.0–100.0)
Monocytes Absolute: 0.4 10*3/uL (ref 0.1–1.0)
Monocytes Relative: 7.2 % (ref 3.0–12.0)
Neutro Abs: 3.5 10*3/uL (ref 1.4–7.7)
Neutrophils Relative %: 58.1 % (ref 43.0–77.0)
Platelets: 247 10*3/uL (ref 150.0–400.0)
RBC: 4.38 Mil/uL (ref 4.22–5.81)
RDW: 13.4 % (ref 11.5–15.5)
WBC: 6 10*3/uL (ref 4.0–10.5)

## 2019-11-17 LAB — LIPID PANEL
Cholesterol: 130 mg/dL (ref 0–200)
HDL: 32.4 mg/dL — ABNORMAL LOW (ref >39.00)
NonHDL: 97.54
Total CHOL/HDL Ratio: 4
Triglycerides: 270 mg/dL — ABNORMAL HIGH (ref 0.0–149.0)
VLDL: 54 mg/dL — ABNORMAL HIGH (ref 0.0–40.0)

## 2019-11-17 LAB — HEPATIC FUNCTION PANEL
ALT: 21 U/L (ref 0–53)
AST: 25 U/L (ref 0–37)
Albumin: 4.4 g/dL (ref 3.5–5.2)
Alkaline Phosphatase: 54 U/L (ref 39–117)
Bilirubin, Direct: 0.1 mg/dL (ref 0.0–0.3)
Total Bilirubin: 0.5 mg/dL (ref 0.2–1.2)
Total Protein: 6.8 g/dL (ref 6.0–8.3)

## 2019-11-17 LAB — C-REACTIVE PROTEIN: CRP: <1 mg/dL (ref 0.5–20.0)

## 2019-11-17 NOTE — Progress Notes (Signed)
Subjective:  Patient ID: Jeffery Holt, male    DOB: [birth year]  Age: 34 y.o. MRN: [id number]  CC: Annual Exam and Lymphadenopathy  This visit occurred during the SARS-CoV-2 public health emergency.  Safety protocols were in place, including screening questions prior to the visit, additional usage of staff PPE, and extensive cleaning of exam room while observing appropriate contact time as indicated for disinfecting solutions.    HPI Jeffery Tosi presents for a CPX.  He continues to be concerned about lymph nodes in the cervical region.  About a year or so ago he saw someone else and had an ultrasound done that showed nonspecific lymphadenopathy in the cervical region.  He has had a few episodes of uveitis and tells me he saw an ophthalmologist and had about 15 blood tests done to screen for connective tissue disease and he tells me that the ophthalmologist told him all of those tests were normal.  He continues to use prednisone eyedrops to control the symptoms.  He denies any recent episodes of fever, chills, night sweats, sore throat, rash, or weight loss.  He does not notice lymph nodes anywhere other than in his neck.  Outpatient Medications Prior to Visit  Medication Sig Dispense Refill  . prednisoLONE acetate (PRED FORTE) 1 % ophthalmic suspension Place 1 drop into both eyes 4 times daily.    . valACYclovir (VALTREX) 1000 MG tablet Take by mouth.     No facility-administered medications prior to visit.    ROS Review of Systems  Constitutional: Negative for chills, diaphoresis, fatigue and fever.  HENT: Negative.  Negative for sore throat, trouble swallowing and voice change.   Eyes: Negative.   Respiratory: Negative for cough, chest tightness, shortness of breath and wheezing.   Cardiovascular: Negative for chest pain, palpitations and leg swelling.  Gastrointestinal: Negative for abdominal pain, constipation, diarrhea, nausea and vomiting.  Endocrine: Negative.   Genitourinary:  Negative.  Negative for difficulty urinating, dysuria, genital sores, penile pain, scrotal swelling, testicular pain and urgency.  Musculoskeletal: Negative.  Negative for arthralgias and myalgias.  Skin: Negative.  Negative for color change and pallor.  Neurological: Negative.   Hematological: Positive for adenopathy. Does not bruise/bleed easily.  Psychiatric/Behavioral: Negative.     Objective:  BP 118/78 (BP Location: Left Arm, Patient Position: Sitting, Cuff Size: Large)   Pulse 67   Temp 98.6 F (37 C) (Oral)   Resp 16   Ht 6\' 1"  (1.854 m)   Wt 228 lb (103.4 kg)   SpO2 97%   BMI 30.08 kg/m   BP Readings from Last 3 Encounters:  11/17/19 118/78  09/12/18 112/76  07/21/18 114/76    Wt Readings from Last 3 Encounters:  11/17/19 228 lb (103.4 kg)  09/12/18 227 lb (103 kg)  07/21/18 221 lb 1.9 oz (100.3 kg)    Physical Exam Vitals reviewed.  Constitutional:      Appearance: Normal appearance.  HENT:     Nose: Nose normal.     Mouth/Throat:     Mouth: Mucous membranes are moist.  Eyes:     General: No scleral icterus.    Conjunctiva/sclera: Conjunctivae normal.  Cardiovascular:     Rate and Rhythm: Normal rate and regular rhythm.     Heart sounds: No murmur.  Pulmonary:     Effort: Pulmonary effort is normal.     Breath sounds: No stridor. No wheezing, rhonchi or rales.  Abdominal:     General: Abdomen is flat.     Palpations:  There is no mass.     Tenderness: There is no abdominal tenderness. There is no guarding.  Musculoskeletal:        General: Normal range of motion.     Cervical back: Neck supple.     Right lower leg: No edema.     Left lower leg: No edema.  Lymphadenopathy:     Head:     Right side of head: No submental, submandibular, preauricular, posterior auricular or occipital adenopathy.     Left side of head: No submandibular, preauricular or occipital adenopathy.     Cervical: Cervical adenopathy present.     Right cervical: Superficial  cervical adenopathy present. No deep or posterior cervical adenopathy.    Left cervical: Superficial cervical adenopathy present. No deep or posterior cervical adenopathy.     Upper Body:     Right upper body: No supraclavicular, axillary, pectoral or epitrochlear adenopathy.     Left upper body: No supraclavicular, axillary, pectoral or epitrochlear adenopathy.     Lower Body: No right inguinal adenopathy. No left inguinal adenopathy.     Comments: There is symmetrical, insignificant but palpable bilateral, superficial, anterior cervical lymph nodes.  They measure about 2 cm each.  They are nontender and freely mobile.  Skin:    General: Skin is warm and dry.     Findings: No rash.  Neurological:     General: No focal deficit present.     Mental Status: He is alert and oriented to person, place, and time.  Psychiatric:        Mood and Affect: Mood normal.        Behavior: Behavior normal.     Lab Results  Component Value Date   WBC 6.0 11/17/2019   HGB 14.3 11/17/2019   HCT 41.5 11/17/2019   PLT 247.0 11/17/2019   GLUCOSE 83 11/17/2019   CHOL 130 11/17/2019   TRIG 270.0 (H) 11/17/2019   HDL 32.40 (L) 11/17/2019   LDLDIRECT 72.0 11/17/2019   LDLCALC 109 09/22/2015   ALT 21 11/17/2019   AST 25 11/17/2019   NA 139 11/17/2019   K 4.1 11/17/2019   CL 104 11/17/2019   CREATININE 1.06 11/17/2019   BUN 15 11/17/2019   CO2 30 11/17/2019   TSH 1.370 09/22/2015    US SOFT TISSUE HEAD & NECK (NON-THYROID)  Result Date: 10/14/2018 CLINICAL DATA:  34 y/o  M; 3 months of lymphadenopathy. EXAM: ULTRASOUND OF HEAD/NECK SOFT TISSUES TECHNIQUE: Ultrasound examination of the head and neck soft tissues was performed in the area of clinical concern. COMPARISON:  None. FINDINGS: Multiple lymph nodes identified within the anterior neck bilaterally. The largest node on the right in the upper anterior neck measures 1.9 x 0.4 x 1.1 cm. On the left the largest nodes measure 1.3 x 0.5 x 1.1 cm and  0.8 x 0.5 x 0.9 cm. No focal mass, fluid collection, or echogenic calcification identified within the enlarged lymph node. On color Doppler lymph nodes demonstrate a normal vascular pedicle. IMPRESSION: Mildly enlarged bilateral upper cervical lymph nodes measuring up to 1.9 cm on the right and 1.3 cm on the left. Findings are likely reactive or related to viral illness. Differential includes sarcoidosis, autoimmune disease, lymphoproliferative disorder, histiocytosis, and atypical infection such as tuberculosis or cat scratch disease. Clinical correlation and follow-up to resolution recommended. Electronically Signed   By: Mitzi Hansen M.D.   On: 10/14/2018 01:37    Assessment & Plan:   [name] was seen today for annual  exam and lymphadenopathy.  Diagnoses and all orders for this visit:  Anterior cervical lymphadenopathy- Based on the chronicity of these lymph nodes, no alarming symptoms, and a reassuring physical examination I think that he has benign lymphadenopathy.  His labs are negative for lymphoproliferative disease, connective tissue disease, sarcoidosis, HIV, or chronic infection.  I offered him reassurance about this.  If he continues to be concerned about this I informed in the next step would be to do a CT with contrast of the area.  He will let me know if he wants to pursue this option. -     Cancel: CBC with Differential/Platelet -     Cancel: C-reactive protein -     Cancel: Hepatic function panel -     Cancel: Basic metabolic panel -     Basic metabolic panel; Future -     CBC with Differential/Platelet; Future -     C-reactive protein; Future -     Hepatic function panel; Future  Routine general medical examination at a health care facility- Exam completed, labs reviewed, vaccines reviewed, patient education was given. -     Cancel: Lipid panel -     Cancel: HIV Antibody (routine testing w rflx) -     HIV Antibody (routine testing w rflx); Future -     Lipid panel;  Future   I have discontinued [name] Nyman's valACYclovir. I am also having him maintain his prednisoLONE acetate.  No orders of the defined types were placed in this encounter.    Follow-up: Return in about 6 weeks (around 12/29/2019).  Sanda Linger, MD

## 2019-11-17 NOTE — Patient Instructions (Signed)

## 2019-11-18 ENCOUNTER — Encounter: Payer: Self-pay | Admitting: Internal Medicine

## 2019-11-18 LAB — LDL CHOLESTEROL, DIRECT: Direct LDL: 72 mg/dL

## 2019-11-18 LAB — HIV ANTIBODY (ROUTINE TESTING W REFLEX): HIV 1&2 Ab, 4th Generation: NONREACTIVE

## 2020-03-28 ENCOUNTER — Encounter: Payer: Self-pay | Admitting: Internal Medicine

## 2020-03-28 ENCOUNTER — Other Ambulatory Visit: Payer: Self-pay | Admitting: Internal Medicine

## 2020-03-28 DIAGNOSIS — R590 Localized enlarged lymph nodes: Secondary | ICD-10-CM

## 2020-04-04 ENCOUNTER — Inpatient Hospital Stay: Admission: RE | Admit: 2020-04-04 | Payer: 59 | Source: Ambulatory Visit

## 2020-04-11 ENCOUNTER — Ambulatory Visit (INDEPENDENT_AMBULATORY_CARE_PROVIDER_SITE_OTHER)
Admission: RE | Admit: 2020-04-11 | Discharge: 2020-04-11 | Disposition: A | Discharge status: Home / Self Care | Payer: 59 | Source: Ambulatory Visit | Attending: Internal Medicine | Admitting: Internal Medicine

## 2020-04-11 ENCOUNTER — Other Ambulatory Visit: Payer: Self-pay

## 2020-04-11 DIAGNOSIS — R590 Localized enlarged lymph nodes: Secondary | ICD-10-CM

## 2020-04-11 MED ORDER — IOHEXOL 300 MG/ML  SOLN
75.0000 mL | Freq: Once | INTRAMUSCULAR | Status: AC | PRN
  Administered 2020-04-11: 75 mL via INTRAVENOUS

## 2020-04-12 ENCOUNTER — Encounter: Payer: Self-pay | Admitting: Internal Medicine

## 2020-04-13 ENCOUNTER — Other Ambulatory Visit: Payer: Self-pay | Admitting: Internal Medicine

## 2020-04-13 DIAGNOSIS — H5789 Other specified disorders of eye and adnexa: Secondary | ICD-10-CM

## 2020-06-07 ENCOUNTER — Other Ambulatory Visit: Payer: 59

## 2020-06-07 DIAGNOSIS — Z20822 Contact with and (suspected) exposure to covid-19: Secondary | ICD-10-CM

## 2020-06-09 LAB — SARS-COV-2, NAA 2 DAY TAT

## 2020-06-09 LAB — NOVEL CORONAVIRUS, NAA: SARS-CoV-2, NAA: NOT DETECTED

## 2020-08-04 NOTE — Progress Notes (Addendum)
Office Visit Note  Patient: Jeffery Holt             Date of Birth: [birth year]           MRN: [id number]             PCP: Janith Lima, MD Referring: Janith Lima, MD Visit Date: 08/05/2020 Occupation: _0 @  Subjective:  Recurrent uveitis.   History of Present Illness: Jeffery Stuck is a 34 y.o. male seen in consultation per request of Dr. Ronnald Ramp.  According to the patient about 2 years ago he developed redness in his right eye.  He was seen by Dr. Wyatt Portela, ophthalmologist and was diagnosed with uveitis.  He states he has had few recurrences since then and the last episode was about 2 months ago requiring eyedrops.  He was also referred to Dr. Manuella Ghazi who did lab work on him.  All the labs were negative per patient.  He states at the same time he started having some dizziness and reflux symptoms.  He was diagnosed with gastritis by his PCP.  He made some dietary changes which improved his symptoms.  He still occasionally gets some flares.  He has had occasional flares of diarrhea.  He also noticed a cervical lymph node at the time which was painful.  He had CT scan of his neck which was negative.  No biopsy was performed.  He states the lymph node comes and goes and becomes painful at times.  He takes ibuprofen which improved the symptoms.  He has been experiencing some discomfort in his knee joints for the last 1 year.  He states he used to jog and participates in the martial arts.  He jogs rarely now.  He notices discomfort in his knee joints when he squats.  He denies any history of joint swelling.  He also recalls having lower back pain several years ago when he is to exercise on a regular basis.  He has not had any recent lower back pain.  There is no history of psoriasis.  There is no family history of psoriasis, psoriatic arthritis or autoimmune disease.  He believes that his paternal grandmother had rheumatoid arthritis.  His mother most likely has osteoarthritis.  Activities of Daily  Living:  Patient reports morning stiffness for a few  minutes.   Patient Denies nocturnal pain.  Difficulty dressing/grooming: Denies Difficulty climbing stairs: Denies Difficulty getting out of chair: Denies Difficulty using hands for taps, buttons, cutlery, and/or writing: Denies  Review of Systems  Constitutional: Positive for night sweats. Negative for fatigue.  HENT: Negative for mouth sores, mouth dryness and nose dryness.   Eyes: Positive for photophobia, pain and redness. Negative for itching and dryness.  Respiratory: Negative for shortness of breath and difficulty breathing.   Cardiovascular: Negative for chest pain and palpitations.  Gastrointestinal: Negative for blood in stool, constipation and diarrhea.  Endocrine: Negative for increased urination.  Genitourinary: Negative for difficulty urinating and painful urination.  Musculoskeletal: Positive for arthralgias, joint pain and morning stiffness. Negative for joint swelling, myalgias, muscle weakness, muscle tenderness and myalgias.  Skin: Negative for color change, rash and redness.  Allergic/Immunologic: Negative for susceptible to infections.  Neurological: Positive for night sweats. Negative for dizziness, numbness, headaches, memory loss and weakness.  Hematological: Positive for swollen glands. Negative for bruising/bleeding tendency.  Psychiatric/Behavioral: Negative for confusion and sleep disturbance.    PMFS History:  Patient Active Problem List   Diagnosis Date Noted  . Routine general  medical examination at a health care facility 11/17/2019  . Patellar tendonitis of both knees 03/14/2017  . Allergic rhinitis 07/12/2014  . GERD (gastroesophageal reflux disease) 01/23/2013    Past Medical History:  Diagnosis Date  . Allergy   . GERD (gastroesophageal reflux disease)   . Petit mal ([place])    as an infant   . Uveitis    per patient, dx by Dr. Katy Fitch     Family History  Problem Relation Age of Onset  .  Rheum arthritis Mother   . Parkinson's disease Mother   . Kidney Stones Father   . Arthritis Maternal Grandmother   . Rheum arthritis Paternal Grandmother   . Alcohol abuse Neg Hx   . Cancer Neg Hx   . COPD Neg Hx   . Depression Neg Hx   . Diabetes Neg Hx   . Drug abuse Neg Hx   . Early death Neg Hx   . Hearing loss Neg Hx   . Heart disease Neg Hx   . Hyperlipidemia Neg Hx   . Hypertension Neg Hx   . Kidney disease Neg Hx   . Stroke Neg Hx   . Colon cancer Neg Hx    Past Surgical History:  Procedure Laterality Date  . Tooth implant Left 10/14/2014   Social History   Social History Narrative  . Not on file   Immunization History  Administered Date(s) Administered  . Influenza Whole 06/22/2014  . Influenza, Seasonal, Injecte, Preservative Fre 08/10/2013  . Influenza,inj,Quad PF,6+ Mos 06/26/2016, 07/02/2017, 05/27/2018, 05/19/2019  . Moderna SARS-COVID-2 Vaccination 10/30/2019, 11/27/2019  . PFIZER SARS-COV-2 Vaccination 07/29/2020  . Tdap 02/11/2015     Objective: Vital Signs: BP (!) 142/91 (BP Location: Right Arm, Patient Position: Sitting, Cuff Size: Normal)   Pulse 62   Resp 17   Ht _0  (1.905 m)   Wt 230 lb (104.3 kg)   BMI 28.75 kg/m    Physical Exam Vitals and nursing note reviewed.  Constitutional:      Appearance: He is well-developed.  HENT:     Head: Normocephalic and atraumatic.  Eyes:     Conjunctiva/sclera: Conjunctivae normal.     Pupils: Pupils are equal, round, and reactive to light.  Cardiovascular:     Rate and Rhythm: Normal rate and regular rhythm.     Heart sounds: Normal heart sounds.  Pulmonary:     Effort: Pulmonary effort is normal.     Breath sounds: Normal breath sounds.  Abdominal:     General: Bowel sounds are normal.     Palpations: Abdomen is soft.  Musculoskeletal:     Cervical back: Normal range of motion and neck supple.  Skin:    General: Skin is warm and dry.     Capillary Refill: Capillary refill takes less  than 2 seconds.     Comments: No rash or nail pitting or nail dystrophy was noted.  Neurological:     Mental Status: He is alert and oriented to person, place, and time.  Psychiatric:        Behavior: Behavior normal.      Musculoskeletal Exam: C-spine thoracic and lumbar spine were in good range of motion.  He had no SI joint tenderness.  Shoulder joints, elbow joints, wrist joints, MCPs PIPs and DIPs with good range of motion with no synovitis.  Hip joints, knee joints, ankles, MTPs and PIPs with good range of motion with no synovitis.  He had no plantar fasciitis or Achilles tendinitis.  CDAI Exam: CDAI Score: -- Patient Global: --; Provider Global: -- Swollen: --; Tender: -- Joint Exam 08/05/2020   No joint exam has been documented for this visit   There is currently no information documented on the homunculus. Go to the Rheumatology activity and complete the homunculus joint exam.  Investigation: No additional findings.  Imaging: No results found.  Recent Labs: Lab Results  Component Value Date   WBC 6.0 11/17/2019   HGB 14.3 11/17/2019   PLT 247.0 11/17/2019   NA 139 11/17/2019   K 4.1 11/17/2019   CL 104 11/17/2019   CO2 30 11/17/2019   GLUCOSE 83 11/17/2019   BUN 15 11/17/2019   CREATININE 1.06 11/17/2019   BILITOT 0.5 11/17/2019   ALKPHOS 54 11/17/2019   AST 25 11/17/2019   ALT 21 11/17/2019   PROT 6.8 11/17/2019   ALBUMIN 4.4 11/17/2019   CALCIUM 9.0 11/17/2019   GFRAA >89 09/22/2015   November 17, 2019 HIV negative, C-reactive protein less than 1.0 January 2020 labs done by Dr. Manuella Ghazi at [facility] hepatitis C, hepatitis B and hepatitis A were negative, HIV was negative, RPR was negative, toxo IgM was 11.2, anti-CCP negative, anti-DNase B negative, p-ANCA and c-ANCA negative, anti-MPO antibody negative, ANA negative  September 12, 2018 chest x-ray normal  April 11, 2020 CT scan of the neck showed lymph nodes within normal limits.  Speciality  Comments: No specialty comments available.  Procedures:  No procedures performed Allergies: Patient has no known allergies.   Assessment / Plan:     Visit Diagnoses: Uveitis -gives history of recurrent uveitis for the last 2 years.  Last episode was about 2 months ago.  He has been followed by Dr. Wyatt Portela and was evaluated by Dr. Dwana Melena in the past.  I reviewed notes from the [facility].  Reviewed all the labs which were performed in the past.  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  He has cats.  All the other serology was negative .  I will obtain following labs.  Plan: Rheumatoid factor, Sedimentation rate, Angiotensin converting enzyme, HLA-B27 antigen, QuantiFERON-TB Gold Plus, Serum protein electrophoresis with reflex, B. burgdorfi antibodies, Tissue transglutaminase, IgA, Gliadin antibodies, serum.  There is no history of psoriasis or psoriatic arthritis or any other autoimmune disease in the family.  He believes his paternal grandmother may have rheumatoid arthritis.  Patellar tendonitis of both knees-he complains of discomfort in his knee joints off and on which appears to be patellar tendinitis.  He relates it to doing certain activities only.  He had no warmth swelling or effusion in his knee joints.  None of the other joints are painful.  History of gastroesophageal reflux (GERD)-he states his reflux symptoms are diet related and improved with dietary modifications.  Abdominal discomfort-he has off-and-on discomfort as his abdominal region.  He gives history of occasional diarrhea.  I discussed colonoscopy which she declined.  There is no family history of IBD.  Seasonal allergic rhinitis due to other allergic trigger  Orders: Orders Placed This Encounter  Procedures  . Rheumatoid factor  . Sedimentation rate  . Angiotensin converting enzyme  . HLA-B27 antigen  . QuantiFERON-TB Gold Plus  . Serum protein electrophoresis with reflex  . B. burgdorfi  antibodies  . Tissue transglutaminase, IgA  . Gliadin antibodies, serum   No orders of the defined types were placed in this encounter.    Follow-Up Instructions: Return for Uveitis, joint pain.   Bo Merino, MD  Note - This record has been created using Dragon software.  Chart creation errors have been sought, but may not always  have been located. Such creation errors do not reflect on  the standard of medical care.  

## 2020-08-05 ENCOUNTER — Other Ambulatory Visit: Payer: Self-pay

## 2020-08-05 ENCOUNTER — Ambulatory Visit (INDEPENDENT_AMBULATORY_CARE_PROVIDER_SITE_OTHER): Payer: 59 | Admitting: Rheumatology

## 2020-08-05 ENCOUNTER — Encounter: Payer: Self-pay | Admitting: Rheumatology

## 2020-08-05 VITALS — BP 142/91 | HR 62 | Resp 17 | Ht 75.0 in | Wt 230.0 lb

## 2020-08-05 DIAGNOSIS — Z8719 Personal history of other diseases of the digestive system: Secondary | ICD-10-CM | POA: Diagnosis not present

## 2020-08-05 DIAGNOSIS — R109 Unspecified abdominal pain: Secondary | ICD-10-CM

## 2020-08-05 DIAGNOSIS — H5789 Other specified disorders of eye and adnexa: Secondary | ICD-10-CM

## 2020-08-05 DIAGNOSIS — J3089 Other allergic rhinitis: Secondary | ICD-10-CM

## 2020-08-05 DIAGNOSIS — M7651 Patellar tendinitis, right knee: Secondary | ICD-10-CM

## 2020-08-05 DIAGNOSIS — M7652 Patellar tendinitis, left knee: Secondary | ICD-10-CM

## 2020-08-05 DIAGNOSIS — H209 Unspecified iridocyclitis: Secondary | ICD-10-CM

## 2020-08-08 LAB — QUANTIFERON-TB GOLD PLUS
Mitogen-NIL: >10 IU/mL
NIL: 0.02 IU/mL
QuantiFERON-TB Gold Plus: NEGATIVE
TB1-NIL: 0 IU/mL
TB2-NIL: 0 IU/mL

## 2020-08-08 LAB — PROTEIN ELECTROPHORESIS, SERUM, WITH REFLEX
Albumin ELP: 4.4 g/dL (ref 3.8–4.8)
Alpha 1: 0.3 g/dL (ref 0.2–0.3)
Alpha 2: 0.7 g/dL (ref 0.5–0.9)
Beta 2: 0.3 g/dL (ref 0.2–0.5)
Beta Globulin: 0.4 g/dL (ref 0.4–0.6)
Gamma Globulin: 0.8 g/dL (ref 0.8–1.7)
Total Protein: 6.9 g/dL (ref 6.1–8.1)

## 2020-08-08 LAB — GLIADIN ANTIBODIES, SERUM
Gliadin IgA: 9.7 U/mL
Gliadin IgG: 41.3 U/mL — ABNORMAL HIGH

## 2020-08-08 LAB — B. BURGDORFI ANTIBODIES: B burgdorferi Ab IgG+IgM: <0.9 index

## 2020-08-08 LAB — ANGIOTENSIN CONVERTING ENZYME: Angiotensin-Converting Enzyme: 31 U/L (ref 9–67)

## 2020-08-08 LAB — TISSUE TRANSGLUTAMINASE, IGA: (tTG) Ab, IgA: 87.8 U/mL — ABNORMAL HIGH

## 2020-08-08 LAB — HLA-B27 ANTIGEN: HLA-B27 Antigen: POSITIVE — AB

## 2020-08-08 LAB — RHEUMATOID FACTOR: Rheumatoid fact SerPl-aCnc: <14 IU/mL (ref <14)

## 2020-08-08 LAB — SEDIMENTATION RATE: Sed Rate: 9 mm/h (ref 0–15)

## 2020-08-25 ENCOUNTER — Encounter: Payer: Self-pay | Admitting: Rheumatology

## 2020-08-29 NOTE — Progress Notes (Signed)
Office Visit Note  Patient: Jeffery Holt             Date of Birth: November 04, 1985           MRN: [id number]             PCP: Janith Lima, MD Referring: Janith Lima, MD Visit Date: 09/06/2020 Occupation: @GUAROCC @  Subjective:  Joint pain and uveitis.   History of Present Illness: Jeffery Holt is a 34 y.o. male with history of recurrent uveitis, joint pain and abdominal bloating.  He returns for follow-up with [place] today.  He states he continues to have some abdominal bloating and discomfort.  He denies any history of diarrhea.  He states his uveitis flares have been mild and usually respond to prednisolone eyedrops which she keeps at home.  He believes he had a mild flare about 2 weeks ago.  He has had only 3 major flares in the past 2 years.  He states he has joint discomfort but no joint swelling.  Activities of Daily Living:  Patient reports morning stiffness for 2 minutes.   Patient Denies nocturnal pain.  Difficulty dressing/grooming: Denies Difficulty climbing stairs: Denies Difficulty getting out of chair: Denies Difficulty using hands for taps, buttons, cutlery, and/or writing: Denies  Review of Systems  Constitutional: Negative for fatigue.  HENT: Negative for mouth dryness.   Eyes: Negative for dryness.  Respiratory: Negative for shortness of breath.   Cardiovascular: Negative for swelling in legs/feet.  Gastrointestinal: Positive for diarrhea.  Endocrine: Positive for heat intolerance and increased urination.  Genitourinary: Negative for difficulty urinating.  Musculoskeletal: Positive for muscle tenderness.  Skin: Negative for rash.  Allergic/Immunologic: Negative for susceptible to infections.  Neurological: Negative for numbness.  Hematological: Negative for bruising/bleeding tendency.  Psychiatric/Behavioral: Negative for sleep disturbance.    PMFS History:  Patient Active Problem List   Diagnosis Date Noted  . Routine general medical examination at a health  care facility 11/17/2019  . Patellar tendonitis of both knees 03/14/2017  . Allergic rhinitis 07/12/2014  . GERD (gastroesophageal reflux disease) 01/23/2013    Past Medical History:  Diagnosis Date  . Allergy   . GERD (gastroesophageal reflux disease)   . Petit mal ([place])    as an infant   . Uveitis    per patient, dx by Dr. Katy Fitch     Family History  Problem Relation Age of Onset  . Rheum arthritis Mother   . Parkinson's disease Mother   . Kidney Stones Father   . Arthritis Maternal Grandmother   . Rheum arthritis Paternal Grandmother   . Alcohol abuse Neg Hx   . Cancer Neg Hx   . COPD Neg Hx   . Depression Neg Hx   . Diabetes Neg Hx   . Drug abuse Neg Hx   . Early death Neg Hx   . Hearing loss Neg Hx   . Heart disease Neg Hx   . Hyperlipidemia Neg Hx   . Hypertension Neg Hx   . Kidney disease Neg Hx   . Stroke Neg Hx   . Colon cancer Neg Hx    Past Surgical History:  Procedure Laterality Date  . Tooth implant Left 10/14/2014   Social History   Social History Narrative  . Not on file   Immunization History  Administered Date(s) Administered  . Influenza Whole 06/22/2014  . Influenza, Seasonal, Injecte, Preservative Fre 08/10/2013  . Influenza,inj,Quad PF,6+ Mos 06/26/2016, 07/02/2017, 05/27/2018, 05/19/2019, 06/28/2020  . Moderna Sars-Covid-2  Vaccination 10/30/2019, 11/27/2019  . PFIZER SARS-COV-2 Vaccination 07/29/2020  . Tdap 02/11/2015     Objective: Vital Signs: BP (!) 141/78 (BP Location: Left Arm, Patient Position: Sitting, Cuff Size: Small)   Pulse 68   Resp 12   Ht 6\' 3"  (1.905 m)   Wt 234 lb (106.1 kg)   BMI 29.25 kg/m    Physical Exam Vitals and nursing note reviewed.  Constitutional:      Appearance: He is well-developed and well-nourished.  HENT:     Head: Normocephalic and atraumatic.  Eyes:     Extraocular Movements: EOM normal.     Conjunctiva/sclera: Conjunctivae normal.     Pupils: Pupils are equal, round, and reactive to  light.     Comments: Bilateral conjunctival injection was noted.  Cardiovascular:     Rate and Rhythm: Normal rate and regular rhythm.     Heart sounds: Normal heart sounds.  Pulmonary:     Effort: Pulmonary effort is normal.     Breath sounds: Normal breath sounds.  Abdominal:     General: Bowel sounds are normal.     Palpations: Abdomen is soft.  Musculoskeletal:     Cervical back: Normal range of motion and neck supple.  Skin:    General: Skin is warm and dry.     Capillary Refill: Capillary refill takes less than 2 seconds.  Neurological:     Mental Status: He is alert and oriented to person, place, and time.  Psychiatric:        Mood and Affect: Mood and affect normal.        Behavior: Behavior normal.      Musculoskeletal Exam: C-spine thoracic lumbar spine with good range of motion.  He had no SI joint tenderness.  Shoulder joints, elbow joints, wrist joints, MCPs PIPs and DIPs with good range of motion with no synovitis.  Hip joints, knee joints, ankles, MTPs and PIPs with good range of motion with no synovitis.  CDAI Exam: CDAI Score: -- Patient Global: --; Provider Global: -- Swollen: --; Tender: -- Joint Exam 09/06/2020   No joint exam has been documented for this visit   There is currently no information documented on the homunculus. Go to the Rheumatology activity and complete the homunculus joint exam.  Investigation: No additional findings.  Imaging: No results found.  Recent Labs: Lab Results  Component Value Date   WBC 6.0 11/17/2019   HGB 14.3 11/17/2019   PLT 247.0 11/17/2019   NA 139 11/17/2019   K 4.1 11/17/2019   CL 104 11/17/2019   CO2 30 11/17/2019   GLUCOSE 83 11/17/2019   BUN 15 11/17/2019   CREATININE 1.06 11/17/2019   BILITOT 0.5 11/17/2019   ALKPHOS 54 11/17/2019   AST 25 11/17/2019   ALT 21 11/17/2019   PROT 6.9 08/05/2020   ALBUMIN 4.4 11/17/2019   CALCIUM 9.0 11/17/2019   GFRAA >89 09/22/2015   QFTBGOLDPLUS NEGATIVE  08/05/2020   August 05, 2020 SPEP normal, TB Gold negative, antigliadin antibody positive, anti-tTG positive, HLA-B27 positive, Borrelia antibody negative, ESR 9, ACE 31  Speciality Comments: No specialty comments available.  Procedures:  No procedures performed Allergies: Patient has no known allergies.   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 History of recurrent uveitis for the last 2 years.  Last episode was about 2 weeks ago.  He states he uses prednisone eyedrops intermittently..  Followed by Dr. August 07, 2020 and Dr. Dione Booze.  He has  not seen Dr. Manuella Ghazi in a long time.  Per his request we will send another referral.- Plan: Ambulatory referral to Ophthalmology he had no synovitis on examination.  Significance of HLA-B27 in association of different diseases were discussed.  Patellar tendonitis of both knees-he has off-and-on discomfort.  He does muscle strengthening exercises.  History of gastroesophageal reflux (GERD)-he has chronic reflux and history of gastritis.  He states he had EGD 2 years ago.  Abdominal discomfort - Anti-tTG and antigliadin antibodies are positive.  I detailed discussion regarding possible celiac disease.  It cannot be confirmed without the biopsy.  He is hesitant to have more medical expenses.  I have advised her to discuss this further with the gastroenterologist who did the endoscopy in the past.  We also had detailed discussion regarding gluten-free diet.  He wants to try gluten-free diet for right now.  Different dietary options were discussed.  Seasonal allergic rhinitis due to other allergic trigger  Orders: Orders Placed This Encounter  Procedures  . Ambulatory referral to Ophthalmology   No orders of the defined types were placed in this encounter.   Follow-Up Instructions: Return in about 4 months (around 01/04/2021) for Uveitis, HLA B-27.   Bo Merino, MD  Note - This record has been created using Editor, commissioning.  Chart  creation errors have been sought, but may not always  have been located. Such creation errors do not reflect on  the standard of medical care.

## 2020-09-06 ENCOUNTER — Ambulatory Visit (INDEPENDENT_AMBULATORY_CARE_PROVIDER_SITE_OTHER): Payer: 59 | Admitting: Rheumatology

## 2020-09-06 ENCOUNTER — Encounter: Payer: Self-pay | Admitting: Rheumatology

## 2020-09-06 ENCOUNTER — Other Ambulatory Visit: Payer: Self-pay

## 2020-09-06 VITALS — BP 141/78 | HR 68 | Resp 12 | Ht 75.0 in | Wt 234.0 lb

## 2020-09-06 DIAGNOSIS — H209 Unspecified iridocyclitis: Secondary | ICD-10-CM | POA: Diagnosis not present

## 2020-09-06 DIAGNOSIS — M7651 Patellar tendinitis, right knee: Secondary | ICD-10-CM

## 2020-09-06 DIAGNOSIS — Z8719 Personal history of other diseases of the digestive system: Secondary | ICD-10-CM | POA: Diagnosis not present

## 2020-09-06 DIAGNOSIS — R109 Unspecified abdominal pain: Secondary | ICD-10-CM | POA: Diagnosis not present

## 2020-09-06 DIAGNOSIS — M7652 Patellar tendinitis, left knee: Secondary | ICD-10-CM

## 2020-09-06 DIAGNOSIS — J3089 Other allergic rhinitis: Secondary | ICD-10-CM

## 2020-09-09 ENCOUNTER — Ambulatory Visit: Payer: 59 | Admitting: Rheumatology

## 2020-09-23 ENCOUNTER — Ambulatory Visit: Payer: 59 | Admitting: Rheumatology

## 2020-11-25 ENCOUNTER — Encounter: Payer: Self-pay | Admitting: Rheumatology

## 2020-12-21 ENCOUNTER — Encounter: Payer: Self-pay | Admitting: Radiology

## 2020-12-26 NOTE — Progress Notes (Shared)
[facility] - Clinic Note  12/27/2020     CHIEF COMPLAINT Patient presents for No chief complaint on file.   HISTORY OF PRESENT ILLNESS: Jeffery Holt is a 35 y.o. male who presents to the clinic today for:     Referring physician: [name], MD [address] [place],  [place] [address]  HISTORICAL INFORMATION:   Selected notes from the MEDICAL RECORD NUMBER Referred by Dr. Corliss Skains for eval of anterior uveitis   CURRENT MEDICATIONS: Current Outpatient Medications (Ophthalmic Drugs)  Medication Sig  . prednisoLONE acetate (PRED FORTE) 1 % ophthalmic suspension Place 1 drop into both eyes 4 times daily. (Patient not taking: No sig reported)   No current facility-administered medications for this visit. (Ophthalmic Drugs)   No current outpatient medications on file. (Other)   No current facility-administered medications for this visit. (Other)      REVIEW OF SYSTEMS:    ALLERGIES No Known Allergies  PAST MEDICAL HISTORY Past Medical History:  Diagnosis Date  . Allergy   . GERD (gastroesophageal reflux disease)   . Petit mal ([facility])    as an infant   . Uveitis    per patient, dx by Dr. Dione Booze    Past Surgical History:  Procedure Laterality Date  . Tooth implant Left 10/14/2014    FAMILY HISTORY Family History  Problem Relation Age of Onset  . Rheum arthritis Mother   . Parkinson's disease Mother   . Kidney Stones Father   . Arthritis Maternal Grandmother   . Rheum arthritis Paternal Grandmother   . Alcohol abuse Neg Hx   . Cancer Neg Hx   . COPD Neg Hx   . Depression Neg Hx   . Diabetes Neg Hx   . Drug abuse Neg Hx   . Early death Neg Hx   . Hearing loss Neg Hx   . Heart disease Neg Hx   . Hyperlipidemia Neg Hx   . Hypertension Neg Hx   . Kidney disease Neg Hx   . Stroke Neg Hx   . Colon cancer Neg Hx     SOCIAL HISTORY Social History   Tobacco Use  . Smoking status: Former Smoker    Quit date: 2006    Years  since quitting: 16.3  . Smokeless tobacco: Never Used  Vaping Use  . Vaping Use: Never used  Substance Use Topics  . Alcohol use: Yes    Comment: social/ rare  . Drug use: No         OPHTHALMIC EXAM: Not recorded     IMAGING AND PROCEDURES  Imaging and Procedures for 12/27/2020           ASSESSMENT/PLAN:    ICD-10-CM   1. Retinal edema  H35.81     1.  2.  3.  Ophthalmic Meds Ordered this visit:  No orders of the defined types were placed in this encounter.      No follow-ups on file.  There are no Patient Instructions on file for this visit.   Explained the diagnoses, plan, and follow up with the patient and they expressed understanding.  Patient expressed understanding of the importance of proper follow up care.   This document serves as a record of services personally performed by Karie Chimera, MD, PhD. It was created on their behalf by Cristopher Estimable, COT an ophthalmic technician. The creation of this record is the provider's dictation and/or activities during the visit.    Electronically signed  by: Cristopher Estimable, COT 4.25.22 @ 3:49 PM  Karie Chimera, M.D., Ph.D. Diseases & Surgery of the Retina and Vitreous [facility]      Abbreviations: M myopia (nearsighted); A astigmatism; H hyperopia (farsighted); P presbyopia; Mrx spectacle prescription;  CTL contact lenses; OD right eye; OS left eye; OU both eyes  XT exotropia; ET esotropia; PEK punctate epithelial keratitis; PEE punctate epithelial erosions; DES dry eye syndrome; MGD meibomian gland dysfunction; ATs artificial tears; PFAT's preservative free artificial tears; NSC nuclear sclerotic cataract; PSC posterior subcapsular cataract; ERM epi-retinal membrane; PVD posterior vitreous detachment; RD retinal detachment; DM diabetes mellitus; DR diabetic retinopathy; NPDR non-proliferative diabetic retinopathy; PDR proliferative diabetic retinopathy; CSME clinically significant  macular edema; DME diabetic macular edema; dbh dot blot hemorrhages; CWS cotton wool spot; POAG primary open angle glaucoma; C/D cup-to-disc ratio; HVF humphrey visual field; GVF goldmann visual field; OCT optical coherence tomography; IOP intraocular pressure; BRVO Branch retinal vein occlusion; CRVO central retinal vein occlusion; CRAO central retinal artery occlusion; BRAO branch retinal artery occlusion; RT retinal tear; SB scleral buckle; PPV pars plana vitrectomy; VH Vitreous hemorrhage; PRP panretinal laser photocoagulation; IVK intravitreal kenalog; VMT vitreomacular traction; MH Macular hole;  NVD neovascularization of the disc; NVE neovascularization elsewhere; AREDS age related eye disease study; ARMD age related macular degeneration; POAG primary open angle glaucoma; EBMD epithelial/anterior basement membrane dystrophy; ACIOL anterior chamber intraocular lens; IOL intraocular lens; PCIOL posterior chamber intraocular lens; Phaco/IOL phacoemulsification with intraocular lens placement; PRK photorefractive keratectomy; LASIK laser assisted in situ keratomileusis; HTN hypertension; DM diabetes mellitus; COPD chronic obstructive pulmonary disease

## 2020-12-27 ENCOUNTER — Encounter (INDEPENDENT_AMBULATORY_CARE_PROVIDER_SITE_OTHER): Payer: BLUE CROSS/BLUE SHIELD | Admitting: Ophthalmology

## 2020-12-27 ENCOUNTER — Encounter (INDEPENDENT_AMBULATORY_CARE_PROVIDER_SITE_OTHER): Payer: Self-pay

## 2020-12-27 ENCOUNTER — Other Ambulatory Visit: Payer: Self-pay

## 2020-12-27 DIAGNOSIS — H3581 Retinal edema: Secondary | ICD-10-CM

## 2021-03-29 ENCOUNTER — Encounter: Payer: Self-pay | Admitting: Internal Medicine

## 2021-03-29 ENCOUNTER — Ambulatory Visit (INDEPENDENT_AMBULATORY_CARE_PROVIDER_SITE_OTHER): Payer: 59 | Admitting: Internal Medicine

## 2021-03-29 ENCOUNTER — Other Ambulatory Visit: Payer: Self-pay

## 2021-03-29 VITALS — BP 134/70 | HR 52 | Temp 98.4°F | Ht 75.0 in | Wt 221.0 lb

## 2021-03-29 DIAGNOSIS — Z0000 Encounter for general adult medical examination without abnormal findings: Secondary | ICD-10-CM | POA: Diagnosis not present

## 2021-03-29 DIAGNOSIS — F411 Generalized anxiety disorder: Secondary | ICD-10-CM | POA: Diagnosis not present

## 2021-03-29 DIAGNOSIS — H3023 Posterior cyclitis, bilateral: Secondary | ICD-10-CM | POA: Insufficient documentation

## 2021-03-29 DIAGNOSIS — R001 Bradycardia, unspecified: Secondary | ICD-10-CM | POA: Insufficient documentation

## 2021-03-29 LAB — LIPID PANEL
Cholesterol: 135 mg/dL (ref 0–200)
HDL: 33.8 mg/dL — ABNORMAL LOW (ref >39.00)
LDL Cholesterol: 81 mg/dL (ref 0–99)
NonHDL: 100.98
Total CHOL/HDL Ratio: 4
Triglycerides: 100 mg/dL (ref 0.0–149.0)
VLDL: 20 mg/dL (ref 0.0–40.0)

## 2021-03-29 LAB — TSH: TSH: 1.32 u[IU]/mL (ref 0.35–5.50)

## 2021-03-29 MED ORDER — PREDNISOLONE ACETATE 1 % OP SUSP
OPHTHALMIC | 0 refills | Status: DC
Start: 2021-03-29 — End: 2022-05-21

## 2021-03-29 MED ORDER — ESCITALOPRAM OXALATE 5 MG PO TABS: 5.0000 mg | ORAL_TABLET | Freq: Every day | ORAL | 0 refills | Status: DC

## 2021-03-29 NOTE — Progress Notes (Addendum)
Subjective:  Patient ID: Jeffery Holt, male    DOB: 1986-08-31  Age: 35 y.o. MRN: [id number]  CC: Annual Exam  This visit occurred during the SARS-CoV-2 public health emergency.  Safety protocols were in place, including screening questions prior to the visit, additional usage of staff PPE, and extensive cleaning of exam room while observing appropriate contact time as indicated for disinfecting solutions.    HPI Jeffery Mcnellis presents for a CPX and f/up -  He continues to complain of red eyes.  He recently had an appointment with an ophthalmologist but he canceled the appointment.  He is using prednisolone eyedrops as needed.  He has seen a rheumatologist and tells me that he is HLA-B 27 positive.  He complains of back pain and arthralgias.  He tells me that anxiety and fear pervades every aspect of his life and affects his behavior.  He runs several miles a day and does not experience chest pain, shortness of breath, palpitations, dizziness, lightheadedness, or near syncope.  Outpatient Medications Prior to Visit  Medication Sig Dispense Refill   prednisoLONE acetate (PRED FORTE) 1 % ophthalmic suspension Place 1 drop into both eyes 4 times daily.     No facility-administered medications prior to visit.    ROS Review of Systems  Constitutional:  Negative for diaphoresis and fatigue.  HENT: Negative.    Eyes:  Positive for redness. Negative for pain, itching and visual disturbance.  Respiratory: Negative.  Negative for cough, chest tightness, shortness of breath and wheezing.   Cardiovascular:  Negative for chest pain, palpitations and leg swelling.  Gastrointestinal:  Negative for abdominal pain and constipation.  Endocrine: Negative.   Genitourinary: Negative.  Negative for scrotal swelling and testicular pain.  Musculoskeletal:  Positive for arthralgias and back pain. Negative for myalgias.  Skin: Negative.   Allergic/Immunologic: Negative.   Neurological: Negative.  Negative for  dizziness, weakness and light-headedness.  Hematological:  Negative for adenopathy. Does not bruise/bleed easily.  Psychiatric/Behavioral:  Negative for decreased concentration, dysphoric mood, self-injury, sleep disturbance and suicidal ideas. The patient is nervous/anxious.    Objective:  BP 134/70 (BP Location: Right Arm, Patient Position: Sitting, Cuff Size: Large)   Pulse (!) 52   Temp 98.4 F (36.9 C) (Oral)   Ht _0  (1.905 m)   Wt 221 lb (100.2 kg)   SpO2 98%   BMI 27.62 kg/m   BP Readings from Last 3 Encounters:  03/29/21 134/70  09/06/20 (!) 141/78  08/05/20 (!) 142/91    Wt Readings from Last 3 Encounters:  03/29/21 221 lb (100.2 kg)  09/06/20 234 lb (106.1 kg)  08/05/20 230 lb (104.3 kg)    Physical Exam Vitals reviewed.  HENT:     Nose: Nose normal.     Mouth/Throat:     Pharynx: Oropharynx is clear.  Eyes:     General: No scleral icterus.    Conjunctiva/sclera:     Right eye: Right conjunctiva is injected. No chemosis, exudate or hemorrhage.    Left eye: Left conjunctiva is injected. No chemosis, exudate or hemorrhage. Cardiovascular:     Rate and Rhythm: Regular rhythm. Bradycardia present.     Heart sounds: Normal heart sounds, S1 normal and S2 normal.    No gallop.     Comments: EKG- Sinus bradycardia, 51 bpm Otherwise normal EKG Pulmonary:     Effort: Pulmonary effort is normal.     Breath sounds: No stridor. No wheezing, rhonchi or rales.  Abdominal:  General: Abdomen is flat.     Palpations: There is no mass.     Tenderness: There is no abdominal tenderness. There is no guarding.     Hernia: No hernia is present.  Musculoskeletal:        General: Normal range of motion.     Cervical back: Neck supple.     Right lower leg: No edema.     Left lower leg: No edema.  Skin:    General: Skin is warm.  Neurological:     General: No focal deficit present.     Mental Status: He is alert.  Psychiatric:        Attention and Perception:  Attention and perception normal.        Mood and Affect: Mood is anxious. Mood is not depressed.        Speech: Speech normal.        Behavior: Behavior normal.        Thought Content: Thought content normal. Thought content is not paranoid. Thought content does not include homicidal ideation.    Lab Results  Component Value Date   WBC 6.0 11/17/2019   HGB 14.3 11/17/2019   HCT 41.5 11/17/2019   PLT 247.0 11/17/2019   GLUCOSE 83 11/17/2019   CHOL 135 03/29/2021   TRIG 100.0 03/29/2021   HDL 33.80 (L) 03/29/2021   LDLDIRECT 72.0 11/17/2019   LDLCALC 81 03/29/2021   ALT 21 11/17/2019   AST 25 11/17/2019   NA 139 11/17/2019   K 4.1 11/17/2019   CL 104 11/17/2019   CREATININE 1.06 11/17/2019   BUN 15 11/17/2019   CO2 30 11/17/2019   TSH 1.32 03/29/2021    CT Soft Tissue Neck W Contrast  Result Date: 04/11/2020 CLINICAL DATA:  35 year old male with intermittent swelling of the left neck. EXAM: CT NECK WITH CONTRAST TECHNIQUE: Multidetector CT imaging of the neck was performed using the standard protocol following the bolus administration of intravenous contrast. CONTRAST:  2m OMNIPAQUE IOHEXOL 300 MG/ML  SOLN COMPARISON:  Neck ultrasound 10/13/2018. FINDINGS: Pharynx and larynx: There is mild asymmetry of the larynx (series 3, image 79) but no laryngeal mass or hyperenhancement. Pharyngeal soft tissue contours are within normal limits. Negative parapharyngeal and retropharyngeal spaces. Salivary glands: Negative sublingual space. Submandibular and parotid glands are within normal limits. Thyroid: Negative. Lymph nodes: There is a marked area of clinical concern along the left lateral neck on series 3, image 40. Directly beneath the skin marker is the inferior pole of the left parotid gland which appears within normal limits (coronal image 27). Just caudal to that level normal left level 2 lymph nodes measure up to 6 mm short axis (series 3, image 49). Contralateral right level 2 lymph  nodes also measure up to 6 mm short axis (sagittal image 50). And other bilateral lymph node stations also appear symmetric and within normal limits. No abnormally enlarged or heterogeneous lymph nodes. Vascular: Major vascular structures in the neck and at the skull base are patent and appear normal. The right vertebral artery is mildly dominant (normal variant). Limited intracranial: Negative. Visualized orbits: Negative. Mastoids and visualized paranasal sinuses: Clear. Skeleton: No osseous abnormality identified. Upper chest: Negative. IMPRESSION: Neck soft tissues and cervical lymph nodes are within normal limits. No mass or abnormality to correspond to the marked area of clinical concern. Recommend follow-up by clinical exam, with repeat Neck CT (IV contrast preferred) if the area further enlarges or becomes painful. Electronically Signed   By: HLemmie Evens  [place] Crane M.D.   On: 04/11/2020 23:23    Assessment & Plan:   Jeffery was seen today for annual exam.  Diagnoses and all orders for this visit:  Uveitis, intermediate, bilateral- Will continue prednisolone as needed.  I encouraged him to follow-up with the eye doctor. -     prednisoLONE acetate (PRED FORTE) 1 % ophthalmic suspension; Place 1 drop into both eyes 4 times daily. -     RPR; Future -     RPR  GAD (generalized anxiety disorder)- Will start Lexapro.  I advised him that we can increase the dose over time if needed.  I encouraged him to start psychotherapy. -     escitalopram (LEXAPRO) 5 MG tablet; Take 1 tablet (5 mg total) by mouth daily. -     TSH; Future -     TSH  Bradycardia- He is asymptomatic with this.  Labs are negative for secondary causes.  No intervention is needed. -     TSH; Future -     EKG 12-Lead -     TSH  Routine general medical examination at a health care facility- Exam completed, labs reviewed, vaccines are up-to-date, no cancer screenings are indicated. -     Lipid panel; Future -     Lipid panel  I am having Jeffery  Eberle start on escitalopram. I am also having him maintain his prednisoLONE acetate.  Meds ordered this encounter  Medications   prednisoLONE acetate (PRED FORTE) 1 % ophthalmic suspension    Sig: Place 1 drop into both eyes 4 times daily.    Dispense:  5 mL    Refill:  0   escitalopram (LEXAPRO) 5 MG tablet    Sig: Take 1 tablet (5 mg total) by mouth daily.    Dispense:  30 tablet    Refill:  0      Follow-up: Return in about 6 months (around 09/29/2021).  Scarlette Calico, MD

## 2021-03-29 NOTE — Patient Instructions (Signed)

## 2021-03-30 ENCOUNTER — Encounter: Payer: Self-pay | Admitting: Internal Medicine

## 2021-03-30 LAB — RPR: RPR Ser Ql: NONREACTIVE

## 2021-04-20 ENCOUNTER — Encounter: Payer: Self-pay | Admitting: Internal Medicine

## 2021-04-21 ENCOUNTER — Other Ambulatory Visit: Payer: Self-pay | Admitting: Internal Medicine

## 2021-04-21 DIAGNOSIS — F411 Generalized anxiety disorder: Secondary | ICD-10-CM

## 2021-04-21 MED ORDER — ESCITALOPRAM OXALATE 5 MG PO TABS: 5.0000 mg | ORAL_TABLET | Freq: Every day | ORAL | 1 refills | Status: DC

## 2021-05-18 NOTE — Progress Notes (Addendum)
Office Visit Note  Patient: Jeffery Holt             Date of Birth: 08/26/1986           MRN: [id number]             PCP: Janith Lima, MD Referring: Janith Lima, MD Visit Date: 05/22/2021 Occupation: @GUAROCC @  Subjective:  Other (Patient reports back pain and discomfort )   History of Present Illness: Jeffery Oyen is a 35 y.o. male with a history of recurrent uveitis.  He states he did not see the ophthalmologist due to busy schedule.  He has been having intermittent lower back pain for last few years.  He states the last episode was about a month ago.  The pain is worse in the morning and then it improved over time.  He also complains of some discomfort in the bilateral hips which he believes could be due to  tightness in the muscles.  He denies any history of plantar fasciitis or Achilles tendinitis.  He has ongoing uveitis.  He also gives history of abdominal bloating.  He states he occasionally have diarrhea depends on what he eats.  He believes that he has gluten sensitivity and he has been avoiding gluten.  Activities of Daily Living:  Patient reports morning stiffness for 1 hour.   Patient Denies nocturnal pain.  Difficulty dressing/grooming: Denies Difficulty climbing stairs: Denies Difficulty getting out of chair: Denies Difficulty using hands for taps, buttons, cutlery, and/or writing: Denies  Review of Systems  Constitutional:  Positive for night sweats. Negative for fatigue.  HENT:  Negative for mouth sores, mouth dryness and nose dryness.   Eyes:  Positive for pain and redness. Negative for itching and dryness.  Respiratory:  Negative for shortness of breath and difficulty breathing.   Cardiovascular:  Negative for chest pain and palpitations.  Gastrointestinal:  Positive for abdominal pain and diarrhea. Negative for blood in stool and constipation.  Endocrine: Negative for increased urination.  Genitourinary:  Negative for difficulty urinating.  Musculoskeletal:   Positive for joint pain, joint pain and morning stiffness. Negative for joint swelling, myalgias, muscle tenderness and myalgias.  Skin:  Negative for color change, rash and redness.  Allergic/Immunologic: Negative for susceptible to infections.  Neurological:  Positive for night sweats. Negative for dizziness, numbness, headaches, memory loss and weakness.  Hematological:  Negative for bruising/bleeding tendency.  Psychiatric/Behavioral:  Negative for confusion.    PMFS History:  Patient Active Problem List   Diagnosis Date Noted   GAD (generalized anxiety disorder) 03/29/2021   Uveitis, intermediate, bilateral 03/29/2021   Bradycardia 03/29/2021   Routine general medical examination at a health care facility 11/17/2019   Patellar tendonitis of both knees 03/14/2017   Allergic rhinitis 07/12/2014   GERD (gastroesophageal reflux disease) 01/23/2013    Past Medical History:  Diagnosis Date   Allergy    GERD (gastroesophageal reflux disease)    Petit mal ([place])    as an infant    Uveitis    per patient, dx by Dr. Katy Fitch     Family History  Problem Relation Age of Onset   Rheum arthritis Mother    Parkinson's disease Mother    Arthritis/Rheumatoid Mother    Tremor Mother    Kidney Stones Father    Arthritis Maternal Grandmother    Rheum arthritis Paternal Grandmother    Alcohol abuse Neg Hx    Cancer Neg Hx    COPD Neg Hx  Depression Neg Hx    Diabetes Neg Hx    Drug abuse Neg Hx    Early death Neg Hx    Hearing loss Neg Hx    Heart disease Neg Hx    Hyperlipidemia Neg Hx    Hypertension Neg Hx    Kidney disease Neg Hx    Stroke Neg Hx    Colon cancer Neg Hx    Past Surgical History:  Procedure Laterality Date   Tooth implant Left 10/14/2014   Social History   Social History Narrative   Not on file   Immunization History  Administered Date(s) Administered   Influenza Whole 06/22/2014   Influenza, Seasonal, Injecte, Preservative Fre 08/10/2013    Influenza,inj,Quad PF,6+ Mos 06/26/2016, 07/02/2017, 05/27/2018, 05/19/2019, 06/28/2020   Moderna Sars-Covid-2 Vaccination 10/30/2019, 11/27/2019   PFIZER(Purple Top)SARS-COV-2 Vaccination 07/29/2020   Tdap 02/11/2015     Objective: Vital Signs: BP 133/83 (BP Location: Left Arm, Patient Position: Sitting, Cuff Size: Normal)   Pulse (!) 54   Ht $R'6\' 3"'di$  (1.905 m)   Wt 222 lb (100.7 kg)   BMI 27.75 kg/m    Physical Exam Vitals and nursing note reviewed.  Constitutional:      Appearance: He is well-developed.  HENT:     Head: Normocephalic and atraumatic.  Eyes:     Conjunctiva/sclera: Conjunctivae normal.     Pupils: Pupils are equal, round, and reactive to light.  Cardiovascular:     Rate and Rhythm: Normal rate and regular rhythm.     Heart sounds: Normal heart sounds.  Pulmonary:     Effort: Pulmonary effort is normal.     Breath sounds: Normal breath sounds.  Abdominal:     General: Bowel sounds are normal.     Palpations: Abdomen is soft.  Musculoskeletal:     Cervical back: Normal range of motion and neck supple.  Skin:    General: Skin is warm and dry.     Capillary Refill: Capillary refill takes less than 2 seconds.  Neurological:     Mental Status: He is alert and oriented to person, place, and time.  Psychiatric:        Behavior: Behavior normal.     Musculoskeletal Exam: C-spine was in good range of motion.  Thoracic and lumbar spine were in good range of motion.  He had no tenderness on palpation of the SI joints.  Shoulder joints, elbow joints, wrist joints, MCPs PIPs and DIPs with good range of motion with no synovitis.  Hip joints, knee joints, ankles, MTPs and PIPs with good range of motion with no synovitis.  He had tightness in his abductors and hamstrings.  There was no evidence of Achilles tendinitis or plantar fasciitis.  CDAI Exam: CDAI Score: -- Patient Global: --; Provider Global: -- Swollen: --; Tender: -- Joint Exam 05/22/2021   No joint exam  has been documented for this visit   There is currently no information documented on the homunculus. Go to the Rheumatology activity and complete the homunculus joint exam.  Investigation: No additional findings.  Imaging: XR Pelvis 1-2 Views  Result Date: 05/22/2021 Bilateral SI joint sclerosis was noted.  More prominent on the right side.  No hip joint narrowing or chondrocalcinosis was noted. 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   Recent Labs: Lab Results  Component Value Date   WBC 6.0 11/17/2019   HGB 14.3 11/17/2019   PLT 247.0 11/17/2019   NA 139 11/17/2019   K 4.1 11/17/2019  CL 104 11/17/2019   CO2 30 11/17/2019   GLUCOSE 83 11/17/2019   BUN 15 11/17/2019   CREATININE 1.06 11/17/2019   BILITOT 0.5 11/17/2019   ALKPHOS 54 11/17/2019   AST 25 11/17/2019   ALT 21 11/17/2019   PROT 6.9 08/05/2020   ALBUMIN 4.4 11/17/2019   CALCIUM 9.0 11/17/2019   GFRAA >89 09/22/2015   QFTBGOLDPLUS NEGATIVE 08/05/2020    Speciality Comments: No specialty comments available.  Procedures:  No procedures performed Allergies: Patient has no known allergies.   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 History of recurrent uveitis for the last 2 years.  He could not keep the appointment with the ophthalmologist.  He will schedule the appointment again.  Patellar tendonitis of both knees-he is currently not having discomfort.  He gives history of tight hamstrings and abductor muscles.  Chronic midline low back pain without sciatica -he states he has been having lower back pain for the last few years.  It comes and goes.  The last episode but was a month ago which severe morning stiffness which eased off as the day past.  Plan: XR Lumbar Spine 2-3 Views, XR Pelvis 1-2 Views.  X-ray obtained today of the lumbar spine did not show any syndesmophytes.  There was no significant disc space narrowing.  SI joints show some sclerosis but no joint  space narrowing or erosive changes.  History of gastroesophageal reflux (GERD)-he gives history of abdominal bloating especially with certain foods.  Abdominal discomfort - Anti-tTG and antigliadin antibodies are positive.  He had endoscopy in the past.  He states he continues to have abdominal discomfort.  He has been on gluten-free diet.  He has occasional diarrhea.  I will refer him to gastroenterology for colonoscopy to evaluate for IBD.  Seasonal allergic rhinitis due to other allergic trigger  Dyslipidemia-patient was interested in reviewing his recent lipid panel.  His HDL was low and LDL was normal.  Diet rich in omega-3 was discussed.  Omega-3 supplements were also discussed.  He does workout on a regular basis.  Orders: Orders Placed This Encounter  Procedures   XR Lumbar Spine 2-3 Views   XR Pelvis 1-2 Views   Ambulatory referral to Gastroenterology    No orders of the defined types were placed in this encounter.    Follow-Up Instructions: Return in about 2 months (around 07/22/2021) for Uveitis.   Bo Merino, MD  Note - This record has been created using Editor, commissioning.  Chart creation errors have been sought, but may not always  have been located. Such creation errors do not reflect on  the standard of medical care.

## 2021-05-22 ENCOUNTER — Other Ambulatory Visit: Payer: Self-pay

## 2021-05-22 ENCOUNTER — Encounter: Payer: Self-pay | Admitting: Rheumatology

## 2021-05-22 ENCOUNTER — Ambulatory Visit: Payer: Self-pay

## 2021-05-22 ENCOUNTER — Ambulatory Visit (INDEPENDENT_AMBULATORY_CARE_PROVIDER_SITE_OTHER): Payer: 59 | Admitting: Rheumatology

## 2021-05-22 VITALS — BP 133/83 | HR 54 | Ht 75.0 in | Wt 222.0 lb

## 2021-05-22 DIAGNOSIS — M7652 Patellar tendinitis, left knee: Secondary | ICD-10-CM

## 2021-05-22 DIAGNOSIS — M7651 Patellar tendinitis, right knee: Secondary | ICD-10-CM

## 2021-05-22 DIAGNOSIS — R109 Unspecified abdominal pain: Secondary | ICD-10-CM

## 2021-05-22 DIAGNOSIS — G8929 Other chronic pain: Secondary | ICD-10-CM | POA: Diagnosis not present

## 2021-05-22 DIAGNOSIS — J3089 Other allergic rhinitis: Secondary | ICD-10-CM

## 2021-05-22 DIAGNOSIS — Z8719 Personal history of other diseases of the digestive system: Secondary | ICD-10-CM | POA: Diagnosis not present

## 2021-05-22 DIAGNOSIS — M5459 Other low back pain: Secondary | ICD-10-CM | POA: Diagnosis not present

## 2021-05-22 DIAGNOSIS — H209 Unspecified iridocyclitis: Secondary | ICD-10-CM

## 2021-05-22 DIAGNOSIS — E785 Hyperlipidemia, unspecified: Secondary | ICD-10-CM

## 2021-05-22 NOTE — Patient Instructions (Signed)

## 2021-06-01 NOTE — Progress Notes (Signed)
[facility] Note  06/05/2021     CHIEF COMPLAINT Patient presents for Retina Evaluation   HISTORY OF PRESENT ILLNESS: Jeffery Holt is a 35 y.o. male who presents to the clinic today for:   HPI     Retina Evaluation   In both eyes.  Associated Symptoms Redness.  I, the attending physician,  performed the HPI with the patient and updated documentation appropriately.        Comments   A few years ago, eye irritation OD.  Dx uveitis by Dr. Katy Fitch.  Drops helped.  It returned a couple of times over the year. PCP ran different test and blood test.  Unable to find anything.   Started seeing a Rheumatologist, Dr. Estanislado Pandy about 6 months ago.  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  Uveitis is one of the symptoms.  Dr. Estanislado Pandy wanted him to have a good eye test to make sure Uveitis has not done any damage to his eyes.   Over the weekend OD is bothering him.  Eyelid feels puffy, can't open it all the way, eye is red.  A little better today.  Uses Prednisolone as needed.  He used this weekend qd on Sunday.  Denies light sensitivity unless uveitis gets really bad.       Last edited by Bernarda Caffey, MD on 06/06/2021 11:49 PM.    Pt is here on the referral of Dr. Estanislado Pandy for concern of uveitis, pt is HLAB27+ which manifests as back pain and uveitis, pt states Dr. Wyatt Portela initially dx him, pt states several years ago he was dx with uveitis, he states he initially thought it was pink eye, pt states he only got the dx of HLAB27 about 6 months ago, pt saw Dr. Manuella Ghazi in February 2020, but nothing was found at that time, pts PCP gave him a rx for Prednisolone that he uses as needed, pt states when he is having a flare up, his eye turns red and watery, pt right eye was hurting yesterday so he used one drop of PF  Referring physician: Bo Merino, MD [address] [place],  [place] [address]  HISTORICAL INFORMATION:   Selected notes from the [facility]  Referred by Dr. Estanislado Pandy for anterior uveitis LEE:  Ocular Hx- PMH-    CURRENT MEDICATIONS: Current Outpatient Medications (Ophthalmic Drugs)  Medication Sig   prednisoLONE acetate (PRED FORTE) 1 % ophthalmic suspension Place 1 drop into both eyes 4 times daily. (Patient taking differently: as needed. Place 1 drop into both eyes 4 times daily.)   No current facility-administered medications for this visit. (Ophthalmic Drugs)   Current Outpatient Medications (Other)  Medication Sig   escitalopram (LEXAPRO) 5 MG tablet Take 1 tablet (5 mg total) by mouth daily.   No current facility-administered medications for this visit. (Other)   REVIEW OF SYSTEMS: ROS   Positive for: Gastrointestinal, Eyes, Psychiatric Negative for: Constitutional, Neurological, Skin, Genitourinary, Musculoskeletal, HENT, Endocrine, Cardiovascular, Respiratory, Allergic/Imm, Heme/Lymph Last edited by Leonie Douglas, COA on 06/05/2021  1:52 PM.     ALLERGIES No Known Allergies  PAST MEDICAL HISTORY Past Medical History:  Diagnosis Date   Allergy    GERD (gastroesophageal reflux disease)    Petit mal ([place])    as an infant    Uveitis    per patient, dx by Dr. Katy Fitch    Past Surgical History:  Procedure Laterality Date   Tooth implant Left 10/14/2014  FAMILY HISTORY Family History  Problem Relation Age of Onset   Rheum arthritis Mother    Parkinson's disease Mother    Arthritis/Rheumatoid Mother    Tremor Mother    Kidney Stones Father    Arthritis Maternal Grandmother    Rheum arthritis Paternal Grandmother    Alcohol abuse Neg Hx    Cancer Neg Hx    COPD Neg Hx    Depression Neg Hx    Diabetes Neg Hx    Drug abuse Neg Hx    Early death Neg Hx    Hearing loss Neg Hx    Heart disease Neg Hx    Hyperlipidemia Neg Hx    Hypertension Neg Hx    Kidney disease Neg Hx    Stroke Neg Hx    Colon cancer Neg Hx     SOCIAL HISTORY Social History   Tobacco Use   Smoking status: Former     Types: Cigarettes    Quit date: 2006    Years since quitting: 16.7   Smokeless tobacco: Never  Vaping Use   Vaping Use: Never used  Substance Use Topics   Alcohol use: Not Currently   Drug use: No       OPHTHALMIC EXAM:  Base Eye Exam     Visual Acuity (Snellen - Linear)       Right Left   Dist cc 20/20 -1 20/20 +2    Correction: Glasses         Tonometry (Tonopen, 1:59 PM)       Right Left   Pressure 15 13         Pupils       Dark Light Shape React APD   Right 5 3 Round Brisk None   Left 5 3 Round Brisk None         Visual Fields (Counting fingers)       Left Right    Full Full         Extraocular Movement       Right Left    Full Full         Neuro/Psych     Oriented x3: Yes   Mood/Affect: Normal         Dilation     Both eyes: 1.0% Mydriacyl, 2.5% Phenylephrine @ 1:59 PM           Slit Lamp and Fundus Exam     External Exam       Right Left   External Normal Normal         Slit Lamp Exam       Right Left   Lids/Lashes Mild Meibomian gland dysfunction Normal   Conjunctiva/Sclera Trace Injection White and quiet   Cornea Clear, no KP Clear   Anterior Chamber Deep and quiet Deep and quiet   Iris Round and dilated Round, dilated   Lens Clear Clear   Vitreous clear clear         Fundus Exam       Right Left   Disc Pink and Sharp, Compact Pink and Sharp, Compact   C/D Ratio 0.1 0.2   Macula Flat, Good foveal reflex, No heme or edema Flat, Good foveal reflex, No heme or edema   Vessels attenuated attenuated   Periphery Attached    Attached, focal pigment clumping superior to disc             Refraction     Wearing Rx  Sphere Cylinder Axis   Right -9.50 +1.50 173   Left -8.50 +2.50 170            IMAGING AND PROCEDURES  Imaging and Procedures for 06/05/2021  OCT, Retina - OU - Both Eyes       Right Eye Quality was good. Central Foveal Thickness: 303. Progression has no prior data.  Findings include normal foveal contour, no IRF, no SRF, vitreomacular adhesion , myopic contour.   Left Eye Quality was good. Central Foveal Thickness: 300. Progression has no prior data. Findings include normal foveal contour, no IRF, no SRF, vitreomacular adhesion , myopic contour.   Notes *Images captured and stored on drive  Diagnosis / Impression:  NFP; no IRF/SRF OU Myopic contour OU VMA OU  Clinical management:  See below  Abbreviations: NFP - Normal foveal profile. CME - cystoid macular edema. PED - pigment epithelial detachment. IRF - intraretinal fluid. SRF - subretinal fluid. EZ - ellipsoid zone. ERM - epiretinal membrane. ORA - outer retinal atrophy. ORT - outer retinal tubulation. SRHM - subretinal hyper-reflective material. IRHM - intraretinal hyper-reflective material            ASSESSMENT/PLAN:    ICD-10-CM   1. HLA-B27 associated acute anterior uveitis  H20.00     2. Retinal edema  H35.81 OCT, Retina - OU - Both Eyes     1. 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 Katy Fitch who saw Dr. Manuella Ghazi in Jan-Feb 2020  - HLA B27 positivity on 1.8.2020  - referred by Dr. Estanislado Pandy  - ant uveitis controlled on PF prn  - pt reports OD usually more affected  - exam today without AC cell or flare OU  - monitor  - pt can f/u here prn -- pt in agreement -- will call if ant uveitis flares  2. No retinal edema on exam or OCT   Ophthalmic Meds Ordered this visit:  No orders of the defined types were placed in this encounter.      Return if symptoms worsen or fail to improve.  There are no Patient Instructions on file for this visit.   Explained the diagnoses, plan, and follow up with the patient and they expressed understanding.  Patient expressed understanding of the importance of proper follow up care.   This document serves as a record of services personally performed by Gardiner Sleeper, MD, PhD. It was created on their behalf by Leeann Must, [place], an ophthalmic technician. The creation of this record is the provider's dictation and/or activities during the visit.    Electronically signed by: Leeann Must, COA $[email or web address]'@TODAY'[email or web address]$ @ 12:04 AM  This document serves as a record of services personally performed by Gardiner Sleeper, MD, PhD. It was created on their behalf by San Jetty. Owens Shark, OA an ophthalmic technician. The creation of this record is the provider's dictation and/or activities during the visit.    Electronically signed by: San Jetty. Owens Shark, [place] 10.03.2022 12:04 AM  Gardiner Sleeper, M.D., Ph.D. Diseases & Surgery of the Retina and Vitreous [facility]  I have reviewed the above documentation for accuracy and completeness, and I agree with the above. Gardiner Sleeper, M.D., Ph.D. 06/07/21 12:04 AM   Abbreviations: M myopia (nearsighted); A astigmatism; H hyperopia (farsighted); P presbyopia; Mrx spectacle prescription;  CTL contact lenses; OD right eye; OS left eye; OU both eyes  XT exotropia; ET esotropia; PEK punctate epithelial keratitis; PEE punctate epithelial  erosions; DES dry eye syndrome; MGD meibomian gland dysfunction; ATs artificial tears; PFAT's preservative free artificial tears; [place] nuclear sclerotic cataract; PSC posterior subcapsular cataract; ERM epi-retinal membrane; PVD posterior vitreous detachment; RD retinal detachment; DM diabetes mellitus; DR diabetic retinopathy; NPDR non-proliferative diabetic retinopathy; PDR proliferative diabetic retinopathy; CSME clinically significant macular edema; DME diabetic macular edema; dbh dot blot hemorrhages; CWS cotton wool spot; POAG primary open angle glaucoma; C/D cup-to-disc ratio; HVF humphrey visual field; GVF goldmann visual field; OCT optical coherence tomography; IOP intraocular pressure; BRVO Branch retinal vein occlusion; CRVO central retinal vein occlusion; CRAO central retinal artery occlusion; BRAO branch retinal artery occlusion; RT retinal  tear; SB scleral buckle; PPV pars plana vitrectomy; VH Vitreous hemorrhage; PRP panretinal laser photocoagulation; IVK intravitreal kenalog; VMT vitreomacular traction; MH Macular hole;  NVD neovascularization of the disc; NVE neovascularization elsewhere; AREDS age related eye disease study; ARMD age related macular degeneration; POAG primary open angle glaucoma; EBMD epithelial/anterior basement membrane dystrophy; ACIOL anterior chamber intraocular lens; IOL intraocular lens; PCIOL posterior chamber intraocular lens; Phaco/IOL phacoemulsification with intraocular lens placement; [place] photorefractive keratectomy; LASIK laser assisted in situ keratomileusis; HTN hypertension; DM diabetes mellitus; COPD chronic obstructive pulmonary disease

## 2021-06-05 ENCOUNTER — Ambulatory Visit (INDEPENDENT_AMBULATORY_CARE_PROVIDER_SITE_OTHER): Payer: 59 | Admitting: Ophthalmology

## 2021-06-05 ENCOUNTER — Other Ambulatory Visit: Payer: Self-pay

## 2021-06-05 DIAGNOSIS — H3581 Retinal edema: Secondary | ICD-10-CM

## 2021-06-05 DIAGNOSIS — H2000 Unspecified acute and subacute iridocyclitis: Secondary | ICD-10-CM | POA: Diagnosis not present

## 2021-06-05 DIAGNOSIS — H30031 Focal chorioretinal inflammation, peripheral, right eye: Secondary | ICD-10-CM

## 2021-06-06 ENCOUNTER — Encounter (INDEPENDENT_AMBULATORY_CARE_PROVIDER_SITE_OTHER): Payer: Self-pay | Admitting: Ophthalmology

## 2021-06-12 HISTORY — PX: COLONOSCOPY: SHX174

## 2021-06-12 LAB — HM COLONOSCOPY

## 2021-06-19 NOTE — Progress Notes (Signed)
Office Visit Note  Patient: Jeffery Holt             Date of Birth: 1985/11/26           MRN: [id number]             PCP: Janith Lima, MD Referring: Janith Lima, MD Visit Date: 06/22/2021 Occupation: @GUAROCC @  Subjective:  Recurrent uveitis and joint pain.   History of Present Illness: Jeffery Holt is a 35 y.o. male with HLA-B27 positive spondyloarthropathy and recurrent uveitis.  He states that he gradually recovered from uveitis.  He still have some redness in his eyes.  He continues to have lower back discomfort which can be very severe with flares.  He has tendinitis in his patellar tendon off and on.  He continues to have abdominal discomfort which she relates to celiac disease.  He had recent colonoscopy which showed erosions in the terminal ileum suspicious of Crohn's disease.  Activities of Daily Living:  Patient reports morning stiffness for 5 minutes.   Patient Denies nocturnal pain.  Difficulty dressing/grooming: Denies Difficulty climbing stairs: Denies Difficulty getting out of chair: Denies Difficulty using hands for taps, buttons, cutlery, and/or writing: Denies  Review of Systems  Constitutional:  Negative for fatigue.  HENT:  Positive for mouth sores. Negative for mouth dryness and nose dryness.   Eyes:  Negative for pain, itching and dryness.  Respiratory:  Negative for shortness of breath and difficulty breathing.   Cardiovascular:  Negative for chest pain and palpitations.  Gastrointestinal:  Positive for abdominal pain and diarrhea. Negative for blood in stool, constipation and vomiting.  Endocrine: Negative for increased urination.  Genitourinary:  Negative for difficulty urinating.  Musculoskeletal:  Positive for morning stiffness. Negative for joint pain, joint pain, joint swelling, myalgias, muscle tenderness and myalgias.  Skin:  Negative for color change, rash, redness and sensitivity to sunlight.  Allergic/Immunologic: Negative for susceptible to  infections.  Neurological:  Negative for dizziness, numbness, headaches, memory loss and weakness.  Hematological:  Negative for bruising/bleeding tendency.  Psychiatric/Behavioral:  Negative for confusion.    PMFS History:  Patient Active Problem List   Diagnosis Date Noted   GAD (generalized anxiety disorder) 03/29/2021   Uveitis, intermediate, bilateral 03/29/2021   Bradycardia 03/29/2021   Routine general medical examination at a health care facility 11/17/2019   Patellar tendonitis of both knees 03/14/2017   Allergic rhinitis 07/12/2014   GERD (gastroesophageal reflux disease) 01/23/2013    Past Medical History:  Diagnosis Date   Allergy    GERD (gastroesophageal reflux disease)    Petit mal ([place])    as an infant    Uveitis    per patient, dx by Dr. Katy Fitch     Family History  Problem Relation Age of Onset   Rheum arthritis Mother    Parkinson's disease Mother    Arthritis/Rheumatoid Mother    Tremor Mother    Kidney Stones Father    Arthritis Maternal Grandmother    Rheum arthritis Paternal Grandmother    Alcohol abuse Neg Hx    Cancer Neg Hx    COPD Neg Hx    Depression Neg Hx    Diabetes Neg Hx    Drug abuse Neg Hx    Early death Neg Hx    Hearing loss Neg Hx    Heart disease Neg Hx    Hyperlipidemia Neg Hx    Hypertension Neg Hx    Kidney disease Neg Hx    Stroke  Neg Hx    Colon cancer Neg Hx    Past Surgical History:  Procedure Laterality Date   COLONOSCOPY  06/12/2021   Tooth implant Left 10/14/2014   Social History   Social History Narrative   Not on file   Immunization History  Administered Date(s) Administered   Influenza Whole 06/22/2014   Influenza, Seasonal, Injecte, Preservative Fre 08/10/2013   Influenza,inj,Quad PF,6+ Mos 06/26/2016, 07/02/2017, 05/27/2018, 05/19/2019, 06/28/2020   Moderna Sars-Covid-2 Vaccination 10/30/2019, 11/27/2019   PFIZER(Purple Top)SARS-COV-2 Vaccination 07/29/2020   Tdap 02/11/2015     Objective: Vital  Signs: BP (!) 150/80 (BP Location: Left Arm, Patient Position: Sitting, Cuff Size: Normal)   Pulse (!) 58   Ht $R'6\' 3"'TL$  (1.905 m)   Wt 220 lb 12.8 oz (100.2 kg)   BMI 27.60 kg/m    Physical Exam Vitals and nursing note reviewed.  Constitutional:      Appearance: He is well-developed.  HENT:     Head: Normocephalic and atraumatic.  Eyes:     Conjunctiva/sclera: Conjunctivae normal.     Pupils: Pupils are equal, round, and reactive to light.  Cardiovascular:     Rate and Rhythm: Normal rate and regular rhythm.     Heart sounds: Normal heart sounds.  Pulmonary:     Effort: Pulmonary effort is normal.     Breath sounds: Normal breath sounds.  Abdominal:     General: Bowel sounds are normal.     Palpations: Abdomen is soft.  Musculoskeletal:     Cervical back: Normal range of motion and neck supple.  Skin:    General: Skin is warm and dry.     Capillary Refill: Capillary refill takes less than 2 seconds.  Neurological:     Mental Status: He is alert and oriented to person, place, and time.  Psychiatric:        Behavior: Behavior normal.     Musculoskeletal Exam: C-spine thoracic and lumbar were in good range of motion.  He had tenderness over SI joints.  Shoulder joints, elbow joints, wrist joints, MCPs PIPs and DIPs with good range of motion without synovitis.  Hip joints, knee joints, ankles, MTPs and PIPs with good range of motion.  There was no evidence of plantar fasciitis or Achilles tendinitis.  CDAI Exam: CDAI Score: -- Patient Global: --; Provider Global: -- Swollen: --; Tender: -- Joint Exam 06/22/2021   No joint exam has been documented for this visit   There is currently no information documented on the homunculus. Go to the Rheumatology activity and complete the homunculus joint exam.  Investigation: No additional findings.  Imaging: OCT, Retina - OU - Both Eyes  Result Date: 06/06/2021 Right Eye Quality was good. Central Foveal Thickness: 303. Progression  has no prior data. Findings include normal foveal contour, no IRF, no SRF, vitreomacular adhesion , myopic contour. Left Eye Quality was good. Central Foveal Thickness: 300. Progression has no prior data. Findings include normal foveal contour, no IRF, no SRF, vitreomacular adhesion , myopic contour. Notes *Images captured and stored on drive Diagnosis / Impression: NFP; no IRF/SRF OU Myopic contour OU VMA OU Clinical management: See below Abbreviations: NFP - Normal foveal profile. CME - cystoid macular edema. PED - pigment epithelial detachment. IRF - intraretinal fluid. SRF - subretinal fluid. EZ - ellipsoid zone. ERM - epiretinal membrane. ORA - outer retinal atrophy. ORT - outer retinal tubulation. SRHM - subretinal hyper-reflective material. IRHM - intraretinal hyper-reflective material    Recent Labs: Lab Results  Component  Value Date   WBC 6.0 11/17/2019   HGB 14.3 11/17/2019   PLT 247.0 11/17/2019   NA 139 11/17/2019   K 4.1 11/17/2019   CL 104 11/17/2019   CO2 30 11/17/2019   GLUCOSE 83 11/17/2019   BUN 15 11/17/2019   CREATININE 1.06 11/17/2019   BILITOT 0.5 11/17/2019   ALKPHOS 54 11/17/2019   AST 25 11/17/2019   ALT 21 11/17/2019   PROT 6.9 08/05/2020   ALBUMIN 4.4 11/17/2019   CALCIUM 9.0 11/17/2019   GFRAA >89 09/22/2015   QFTBGOLDPLUS NEGATIVE 08/05/2020   August 23, 2020 TSH normal, ESR 9, SPEP normal, TB Gold negative, RF negative, ACE negative, HLA-B27 positive, Lyme negative, anti-tTG positive, antigliadin positive  June 12, 2021 colonoscopy was performed by Dr. Collene Mares which showed a single erosion in the distal ascending colon, erythematous mucosa in the terminal ileum with erosions.  The biopsy of the erosion per pathology report could be consistent with Crohn's disease or drug-induced injury.  Speciality Comments: No specialty comments available.  Procedures:  No procedures performed Allergies: Patient has no known allergies.   Assessment / Plan:      Visit Diagnoses: Uveitis - HLA-B27 positive, history of recurrent uveitis.  Followed by Dr. Coralyn Pear.  Spondyloarthropathy-he has been experiencing lower back pain for the last few years.  He has episodes of severe morning stiffness and SI joint pain.  The x-rays obtained at the last visit of lumbar spine were unremarkable.  SI joint sclerosis was noted.  No erosive changes were noted.  No SI joint narrowing was noted.  Patellar tendonitis of both knees-he has off-and-on tendinitis in the patellar tendon.  He also complains of hip joint discomfort but the hip joint x-rays were unremarkable.  Sacroiliitis (HCC) - History of chronic SI joint pain.  X-ray showed bilateral SI joint sclerosis.  Chronic midline low back pain without sciatica - X-rays of the lumbar spine were unremarkable.  HLA B27 positive  Ileitis - Anti-tTG positive, antigliadin positive.  Patient states he had endoscopy in the past which was negative.  He recently had colonoscopy by Dr. Collene Mares which showed erosive changes in the terminal ileum.  Biopsy was consistent with Crohn's disease or drug-induced injury.  Patient states in the past he is taken occasional ibuprofen.  He has not been taking NSAIDs on a regular basis.  He gives history of occasional diarrhea and abdominal discomfort.  Crohn's is most likely diagnosis.  High risk medication use -I detailed discussion with him regarding different treatment options and their side effects.  We discussed possible use of Humira which may help his symptoms of uveitis, sacroiliitis and ileitis.  Different treatment options and their side effects were discussed.  Indication side effects contraindications of Humira were discussed at length.  Handout was given and consent was taken.  We will apply for Humira.  Plan: IgG, IgA, IgM within next labs.  Medication counseling:   TB Test: 2021 Hepatitis panel: 09/10/2018 HIV: 11/17/2019 SPEP: 2021 Immunoglobulins: With next labs  Chest x-ray:  September 12, 2018  Does patient have diagnosis of heart failure?  No  Counseled patient that Humira is a TNF blocking agent.  Reviewed Humira dose of 40 mg every other week.  Counseled patient on purpose, proper use, and adverse effects of Humira.  Reviewed the most common adverse effects including infections, headache, and injection site reactions. Discussed that there is the possibility of an increased risk of malignancy but it is not well understood if this increased risk  is due to the medication or the disease state.  Advised patient to get yearly dermatology exams due to risk of skin cancer.  Reviewed the importance of regular labs while on Humira therapy.  Advised patient to get standing labs one month after starting Humira then every 3 months.  Provided patient with standing lab orders.  Counseled patient that Humira should be held prior to scheduled surgery.  Counseled patient to avoid live vaccines while on Humira.  Advised patient to get annual influenza vaccine and the pneumococcal vaccine as needed.  Provided patient with medication education material and answered all questions.  Patient voiced understanding.  Patient consented to Humira.  Will upload consent into the media tab.  Reviewed storage instructions of Humira.  Advised initial injection must be administered in office.    Patient voiced understanding.     History of gastroesophageal reflux (GERD)  Seasonal allergic rhinitis due to other allergic trigger  Dyslipidemia  Orders: Orders Placed This Encounter  Procedures   IgG, IgA, IgM    No orders of the defined types were placed in this encounter.    Follow-Up Instructions: Return in about 6 weeks (around 08/03/2021) for Uveitis, spondyloarthropathy, ileitis.   Bo Merino, MD  Note - This record has been created using Editor, commissioning.  Chart creation errors have been sought, but may not always  have been located. Such creation errors do not reflect on  the  standard of medical care.

## 2021-06-22 ENCOUNTER — Encounter: Payer: Self-pay | Admitting: Rheumatology

## 2021-06-22 ENCOUNTER — Ambulatory Visit: Payer: 59 | Admitting: Rheumatology

## 2021-06-22 ENCOUNTER — Other Ambulatory Visit: Payer: Self-pay

## 2021-06-22 VITALS — BP 150/80 | HR 58 | Ht 75.0 in | Wt 220.8 lb

## 2021-06-22 DIAGNOSIS — M545 Low back pain, unspecified: Secondary | ICD-10-CM

## 2021-06-22 DIAGNOSIS — M7652 Patellar tendinitis, left knee: Secondary | ICD-10-CM

## 2021-06-22 DIAGNOSIS — Z79899 Other long term (current) drug therapy: Secondary | ICD-10-CM

## 2021-06-22 DIAGNOSIS — Z8719 Personal history of other diseases of the digestive system: Secondary | ICD-10-CM

## 2021-06-22 DIAGNOSIS — Z1589 Genetic susceptibility to other disease: Secondary | ICD-10-CM

## 2021-06-22 DIAGNOSIS — E785 Hyperlipidemia, unspecified: Secondary | ICD-10-CM

## 2021-06-22 DIAGNOSIS — J3089 Other allergic rhinitis: Secondary | ICD-10-CM

## 2021-06-22 DIAGNOSIS — M461 Sacroiliitis, not elsewhere classified: Secondary | ICD-10-CM

## 2021-06-22 DIAGNOSIS — H209 Unspecified iridocyclitis: Secondary | ICD-10-CM | POA: Diagnosis not present

## 2021-06-22 DIAGNOSIS — M47819 Spondylosis without myelopathy or radiculopathy, site unspecified: Secondary | ICD-10-CM | POA: Diagnosis not present

## 2021-06-22 DIAGNOSIS — G8929 Other chronic pain: Secondary | ICD-10-CM

## 2021-06-22 DIAGNOSIS — K529 Noninfective gastroenteritis and colitis, unspecified: Secondary | ICD-10-CM

## 2021-06-22 DIAGNOSIS — M7651 Patellar tendinitis, right knee: Secondary | ICD-10-CM | POA: Diagnosis not present

## 2021-06-22 NOTE — Patient Instructions (Addendum)
Adalimumab Injection What is this medication? ADALIMUMAB (ay da LIM yoo mab) is used to treat rheumatoid and psoriatic arthritis. It is also used to treat ankylosing spondylitis, Crohn's disease,ulcerative colitis, plaque psoriasis, hidradenitis suppurativa, and uveitis. This medicine may be used for other purposes; ask your health care provider orpharmacist if you have questions. COMMON BRAND NAME(S): CYLTEZO, Humira What should I tell my care team before I take this medication? They need to know if you have any of these conditions: cancer diabetes (high blood sugar) having surgery heart disease hepatitis B immune system problems infections, such as tuberculosis (TB) or other bacterial, fungal, or viral infections multiple sclerosis recent or upcoming vaccine an unusual reaction to adalimumab, mannitol, latex, rubber, other medicines, foods, dyes, or preservatives pregnant or trying to get pregnant breast-feeding How should I use this medication? This medicine is for injection under the skin. You will be taught how to prepare and give it. Take it as directed on the prescription label. Keep takingit unless your health care provider tells you to stop. It is important that you put your used needles and syringes in a special sharps container. Do not put them in a trash can. If you do not have a sharpscontainer, call your pharmacist or health care provider to get one. This medicine comes with INSTRUCTIONS FOR USE. Ask your pharmacist for directions on how to use this medicine. Read the information carefully. Talk toyour pharmacist or health care provider if you have questions. A special MedGuide will be given to you by the pharmacist with eachprescription and refill. Be sure to read this information carefully each time. Talk to your pediatrician regarding the use of this medicine in children. While this drug may be prescribed for children as young as 2 years for selectedconditions, precautions do  apply. Overdosage: If you think you have taken too much of this medicine contact apoison control center or emergency room at once. NOTE: This medicine is only for you. Do not share this medicine with others. What if I miss a dose? If you miss a dose, take it as soon as you can. If it is almost time for your next dose, take only that dose. Do not take double or extra doses. It is important not to miss any doses. Talk to your health care provider about whatto do if you miss a dose. What may interact with this medication? Do not take this medicine with any of the following medications: abatacept anakinra biologic medicines such as certolizumab, etanercept, golimumab, infliximab live virus vaccines This medicine may also interact with the following medications: cyclosporine theophylline vaccines warfarin This list may not describe all possible interactions. Give your health care provider a list of all the medicines, herbs, non-prescription drugs, or dietary supplements you use. Also tell them if you smoke, drink alcohol, or use illegaldrugs. Some items may interact with your medicine. What should I watch for while using this medication? Visit your health care provider for regular checks on your progress. Tell your health care provider if your symptoms do not start to get better or if they getworse. You will be tested for tuberculosis (TB) before you start this medicine. If your doctor prescribes any medicine for TB, you should start taking the TB medicine before starting this medicine. Make sure to finish the full course ofTB medicine. This medicine may increase your risk of getting an infection. Call your health care provider for advice if you get a fever, chills, sore throat, or other symptoms of a cold   or flu. Do not treat yourself. Try to avoid being aroundpeople who are sick. Talk to your health care provider about your risk of cancer. You may be more atrisk for certain types of cancer if you  take this medicine. What side effects may I notice from receiving this medication? Side effects that you should report to your doctor or health care professionalas soon as possible: allergic reactions like skin rash, itching or hives, swelling of the face, lips, or tongue changes in vision chest pain dizziness heart failure (trouble breathing; fast, irregular heartbeat; sudden weight gain; swelling of the ankles, feet, hands; unusually weak or tired) infection (fever, chills, cough, sore throat, pain or trouble passing urine) liver injury (dark yellow or brown urine; general ill feeling or flu-like symptoms; loss of appetite, right upper belly pain; unusually weak or tired, yellowing of the eyes or skin) lump or swollen lymph nodes on the neck, groin, or underarm area muscle weakness pain, tingling, numbness in the hands or feet red, scaly patches or raised bumps on the skin trouble breathing unusual bleeding or bruising unusually weak or tired Side effects that usually do not require medical attention (report to yourdoctor or health care professional if they continue or are bothersome): headache nausea pain, redness, or irritation at site where injected stuffy or runny nose This list may not describe all possible side effects. Call your doctor for medical advice about side effects. You may report side effects to FDA at1-800-FDA-1088. Where should I keep my medication? Keep out of the reach of children and pets. Store in the refrigerator between 2 and 8 degrees C (36 and 46 degrees F). Do not freeze. Keep this medicine in the original packaging until you are ready to take it. Protect from light. Get rid of any unused medicine after theexpiration date. This medicine may be stored at room temperature for up to 14 days. Keep this medicine in the original packaging. Protect from light. If it is stored at room temperature, get rid of any unused medicine after 14 days or after it  expires,whichever is first. To get rid of medicines that are no longer needed or have expired: Take the medicine to a medicine take-back program. Check with your pharmacy or law enforcement to find a location. If you cannot return the medicine, ask your pharmacist or health care provider how to get rid of this medicine safely. NOTE: This sheet is a summary. It may not cover all possible information. If you have questions about this medicine, talk to your doctor, pharmacist, orhealth care provider.  2022 Elsevier/Gold Standard (2019-10-29 17:28:40)  

## 2021-07-06 ENCOUNTER — Encounter: Payer: Self-pay | Admitting: Rheumatology

## 2021-07-07 ENCOUNTER — Telehealth: Payer: Self-pay

## 2021-07-07 NOTE — Telephone Encounter (Addendum)
Submitted an URGENT Prior Authorization request to [facility] for HUMIRA via CoverMyMeds. Will update once we receive a response.  Key: [id number]  Patient eligible for Humira Complete savings card once approved through insurance which should reduce his copay to no more than $5 per month supply  Chesley Mires, PharmD, MPH, BCPS Clinical Pharmacist (Rheumatology and Pulmonology)

## 2021-07-07 NOTE — Telephone Encounter (Signed)
Please apply for humira, per Dr. Corliss Skains. Thanks!   Patient was consented at his last visit.

## 2021-07-10 NOTE — Telephone Encounter (Signed)
Received a fax regarding Prior Authorization from [facility] for HUMIRA. Authorization has been CANCELLED.  PA Case ID: [id number] Phone# [phone]

## 2021-07-11 ENCOUNTER — Other Ambulatory Visit (HOSPITAL_COMMUNITY): Payer: Self-pay

## 2021-07-11 NOTE — Telephone Encounter (Signed)
Received notification from [facility] regarding a prior authorization for HUMIRA. Authorization has been APPROVED from 07/07/21 to 01/03/22.   Unable to run test claim since [facility] is out of network. Patient must fill through [employer]: [phone]  Authorization # [id number]  Uploaded patient into [employer] Complete. Awaiting copay card information to populate  Chesley Mires, PharmD, MPH, BCPS Clinical Pharmacist (Rheumatology and Pulmonology)

## 2021-07-12 NOTE — Telephone Encounter (Signed)
Copay card information activated by calling Complete Pro: BIN: [id number] PCN: [id number] Group: [id number] ID: [id number] Suf: 01 Activated date: 07/12/21  Patient scheduled for Humira new start visit on 07/13/21 @ 9am (advised to come earlier if he would like and I would bring him back as soon as I could)  Chesley Mires, PharmD, MPH, BCPS Clinical Pharmacist (Rheumatology and Pulmonology)

## 2021-07-13 ENCOUNTER — Ambulatory Visit: Payer: 59 | Admitting: Pharmacist

## 2021-07-13 ENCOUNTER — Other Ambulatory Visit: Payer: Self-pay

## 2021-07-13 VITALS — BP 143/84 | HR 56

## 2021-07-13 DIAGNOSIS — Z79899 Other long term (current) drug therapy: Secondary | ICD-10-CM

## 2021-07-13 DIAGNOSIS — M47819 Spondylosis without myelopathy or radiculopathy, site unspecified: Secondary | ICD-10-CM

## 2021-07-13 DIAGNOSIS — H209 Unspecified iridocyclitis: Secondary | ICD-10-CM

## 2021-07-13 MED ORDER — HUMIRA (2 PEN) 40 MG/0.4ML ~~LOC~~ AJKT: 40.0000 mg | AUTO-INJECTOR | SUBCUTANEOUS | 0 refills | Status: DC

## 2021-07-13 NOTE — Progress Notes (Signed)
Pharmacy Note  Subjective:   Patient presents to clinic today to receive first dose of Humira for spondyloarthropathy and uveitis. He is naive to biologics.  Patient running a fever or have signs/symptoms of infection? No  Patient currently on antibiotics for the treatment of infection? No  Patient have any upcoming invasive procedures/surgeries? No  Objective: CMP     Component Value Date/Time   NA 139 11/17/2019 1705   K 4.1 11/17/2019 1705   CL 104 11/17/2019 1705   CO2 30 11/17/2019 1705   GLUCOSE 83 11/17/2019 1705   BUN 15 11/17/2019 1705   CREATININE 1.06 11/17/2019 1705   CREATININE 1.15 09/22/2015 1556   CALCIUM 9.0 11/17/2019 1705   PROT 6.9 08/05/2020 0943   ALBUMIN 4.4 11/17/2019 1705   AST 25 11/17/2019 1705   ALT 21 11/17/2019 1705   ALKPHOS 54 11/17/2019 1705   BILITOT 0.5 11/17/2019 1705   GFRNONAA 84 09/22/2015 1556   GFRAA >89 09/22/2015 1556    CBC    Component Value Date/Time   WBC 6.0 11/17/2019 1705   RBC 4.38 11/17/2019 1705   HGB 14.3 11/17/2019 1705   HCT 41.5 11/17/2019 1705   PLT 247.0 11/17/2019 1705   MCV 94.8 11/17/2019 1705   MCV 93.1 09/22/2015 1606   MCH 31.8 (A) 09/22/2015 1606   MCH 33.0 01/23/2013 1652   MCHC 34.5 11/17/2019 1705   RDW 13.4 11/17/2019 1705   LYMPHSABS 1.9 11/17/2019 1705   MONOABS 0.4 11/17/2019 1705   EOSABS 0.2 11/17/2019 1705   BASOSABS 0.0 11/17/2019 1705    Baseline Immunosuppressant Therapy Labs TB GOLD Quantiferon TB Gold Latest Ref Rng & Units 08/05/2020  Quantiferon TB Gold Plus NEGATIVE NEGATIVE   Hepatitis Panel   HIV Lab Results  Component Value Date   HIV NON-REACTIVE 11/17/2019   Immunoglobulins   SPEP Serum Protein Electrophoresis Latest Ref Rng & Units 08/05/2020  Total Protein 6.1 - 8.1 g/dL 6.9  Albumin 3.8 - 4.8 g/dL 4.4  Alpha-1 0.2 - 0.3 g/dL 0.3  Alpha-2 0.5 - 0.9 g/dL 0.7  Beta Globulin 0.4 - 0.6 g/dL 0.4  Beta 2 0.2 - 0.5 g/dL 0.3  Gamma Globulin 0.8 - 1.7 g/dL 0.8    V0JJ No results found for: G6PDH TPMT No results found for: TPMT   Chest x-ray: 09/12/2018 - no active cardiopulmonary disease  Assessment/Plan:  Demonstrated proper injection technique with Humira demo device  Patient able to demonstrate proper injection technique using the teach back method.  Patient self injected in the right lower abdomen with:  Sample Medication: Humira 40mg /0.4 mL autoinjector pen NDC: Lot: [id number] Expiration: 08/2022  Patient tolerated well.  Observed for 30 mins in office for adverse reaction and none noted.   Patient is to return in 1 month for labs and 6-8 weeks for follow-up appointment.  Standing orders placed.   Humira approved through insurance .   Rx sent to: [employer]: [phone].  Patient provided with pharmacy phone number and advised to call later this week to schedule shipment to home.  All questions encouraged and answered.  Instructed patient to call with any further questions or concerns.  Referral to dermatology placed today for yearly skin checks while taking TNF inhibitor.  [phone], PharmD, MPH, BCPS Clinical Pharmacist (Rheumatology and Pulmonology)  07/13/2021 8:51 AM

## 2021-07-13 NOTE — Patient Instructions (Signed)
Your next HUMIRA dose is due on 07/27/21, 08/10/21, and every 14 days thereafter  HOLD HUMIRA if you have signs or symptoms of an infection. You can resume once you feel better or back to your baseline. HOLD HUMIRA if you start antibiotics to treat an infection. HOLD HUMIRA around the time of surgery/procedures. Your surgeon will be able to provide recommendations on when to hold BEFORE and when you are cleared to RESUME.  Pharmacy information: Your prescription will be shipped from [facility]. Their phone number is [phone] Please call to schedule shipment and confirm address. They will mail your medication to your home.  Cost information: Your copay should be affordable. If you call the pharmacy and it is not affordable, please double-check that they are billing through your copay card as secondary coverage. That copay card information is: BIN: [id number] PCN: [id number] Group: [id number] ID: [id number] Suf: 01 Activated date: 07/12/21  Labs are due in 1 month then every 3 months. Lab hours are from Monday to Thursday 1:30-4:30pm and Friday 1:30-4pm. You do not need an appointment if you come for labs during these times.  How to manage an injection site reaction: Remember the 5 C's: COUNTER - leave on the counter at least 30 minutes but up to overnight to bring medication to room temperature. This may help prevent stinging COLD - place something cold (like an ice gel pack or cold water bottle) on the injection site just before cleansing with alcohol. This may help reduce pain CLARITIN - use Claritin (generic name is loratadine) for the first two weeks of treatment or the day of, the day before, and the day after injecting. This will help to minimize injection site reactions CORTISONE CREAM - apply if injection site is irritated and itching CALL ME - if injection site reaction is bigger than the size of your fist, looks infected, blisters, or if you develop hives

## 2021-07-17 ENCOUNTER — Ambulatory Visit: Payer: 59 | Admitting: Rheumatology

## 2021-07-27 ENCOUNTER — Encounter: Payer: Self-pay | Admitting: Rheumatology

## 2021-07-31 NOTE — Telephone Encounter (Signed)
Please advise the patient to hold humira until his symptoms have completely resolved.  He should be evaluated at urgent care or PCPs office.

## 2021-08-18 ENCOUNTER — Other Ambulatory Visit: Payer: Self-pay

## 2021-08-18 DIAGNOSIS — Z79899 Other long term (current) drug therapy: Secondary | ICD-10-CM

## 2021-08-18 DIAGNOSIS — Z111 Encounter for screening for respiratory tuberculosis: Secondary | ICD-10-CM

## 2021-08-23 LAB — COMPLETE METABOLIC PANEL WITH GFR
AG Ratio: 2.3 (calc) (ref 1.0–2.5)
ALT: 29 U/L (ref 9–46)
AST: 28 U/L (ref 10–40)
Albumin: 4.6 g/dL (ref 3.6–5.1)
Alkaline phosphatase (APISO): 37 U/L (ref 36–130)
BUN: 15 mg/dL (ref 7–25)
CO2: 29 mmol/L (ref 20–32)
Calcium: 9.2 mg/dL (ref 8.6–10.3)
Chloride: 105 mmol/L (ref 98–110)
Creat: 1.1 mg/dL (ref 0.60–1.26)
Globulin: 2 g/dL (calc) (ref 1.9–3.7)
Glucose, Bld: 77 mg/dL (ref 65–99)
Potassium: 4.4 mmol/L (ref 3.5–5.3)
Sodium: 141 mmol/L (ref 135–146)
Total Bilirubin: 0.8 mg/dL (ref 0.2–1.2)
Total Protein: 6.6 g/dL (ref 6.1–8.1)
eGFR: 89 mL/min/{1.73_m2} (ref >=60)

## 2021-08-23 LAB — CBC WITH DIFFERENTIAL/PLATELET
Absolute Monocytes: 453 cells/uL (ref 200–950)
Basophils Absolute: 19 cells/uL (ref 0–200)
Basophils Relative: 0.3 %
Eosinophils Absolute: 161 cells/uL (ref 15–500)
Eosinophils Relative: 2.6 %
HCT: 42.8 % (ref 38.5–50.0)
Hemoglobin: 15 g/dL (ref 13.2–17.1)
Lymphs Abs: 2226 cells/uL (ref 850–3900)
MCH: 32.6 pg (ref 27.0–33.0)
MCHC: 35 g/dL (ref 32.0–36.0)
MCV: 93 fL (ref 80.0–100.0)
MPV: 9.8 fL (ref 7.5–12.5)
Monocytes Relative: 7.3 %
Neutro Abs: 3342 cells/uL (ref 1500–7800)
Neutrophils Relative %: 53.9 %
Platelets: 233 10*3/uL (ref 140–400)
RBC: 4.6 10*6/uL (ref 4.20–5.80)
RDW: 13.5 % (ref 11.0–15.0)
Total Lymphocyte: 35.9 %
WBC: 6.2 10*3/uL (ref 3.8–10.8)

## 2021-08-23 LAB — QUANTIFERON-TB GOLD PLUS
Mitogen-NIL: >10 IU/mL
NIL: 0.04 IU/mL
QuantiFERON-TB Gold Plus: NEGATIVE
TB1-NIL: 0.01 IU/mL
TB2-NIL: 0.02 IU/mL

## 2021-08-23 LAB — IGG, IGA, IGM
IgG (Immunoglobin G), Serum: 804 mg/dL (ref 600–1640)
IgM, Serum: 132 mg/dL (ref 50–300)
Immunoglobulin A: 214 mg/dL (ref 47–310)

## 2021-09-06 ENCOUNTER — Telehealth: Payer: Self-pay

## 2021-09-06 ENCOUNTER — Ambulatory Visit (INDEPENDENT_AMBULATORY_CARE_PROVIDER_SITE_OTHER): Payer: 59 | Admitting: Rheumatology

## 2021-09-06 ENCOUNTER — Encounter: Payer: Self-pay | Admitting: Rheumatology

## 2021-09-06 ENCOUNTER — Other Ambulatory Visit: Payer: Self-pay

## 2021-09-06 VITALS — BP 131/83 | HR 60 | Ht 74.0 in | Wt 233.4 lb

## 2021-09-06 DIAGNOSIS — M545 Low back pain, unspecified: Secondary | ICD-10-CM

## 2021-09-06 DIAGNOSIS — H209 Unspecified iridocyclitis: Secondary | ICD-10-CM

## 2021-09-06 DIAGNOSIS — Z79899 Other long term (current) drug therapy: Secondary | ICD-10-CM | POA: Diagnosis not present

## 2021-09-06 DIAGNOSIS — J3089 Other allergic rhinitis: Secondary | ICD-10-CM

## 2021-09-06 DIAGNOSIS — E785 Hyperlipidemia, unspecified: Secondary | ICD-10-CM

## 2021-09-06 DIAGNOSIS — M47819 Spondylosis without myelopathy or radiculopathy, site unspecified: Secondary | ICD-10-CM

## 2021-09-06 DIAGNOSIS — Z1589 Genetic susceptibility to other disease: Secondary | ICD-10-CM

## 2021-09-06 DIAGNOSIS — M461 Sacroiliitis, not elsewhere classified: Secondary | ICD-10-CM

## 2021-09-06 DIAGNOSIS — G8929 Other chronic pain: Secondary | ICD-10-CM

## 2021-09-06 DIAGNOSIS — M7652 Patellar tendinitis, left knee: Secondary | ICD-10-CM

## 2021-09-06 DIAGNOSIS — Z8719 Personal history of other diseases of the digestive system: Secondary | ICD-10-CM

## 2021-09-06 DIAGNOSIS — K529 Noninfective gastroenteritis and colitis, unspecified: Secondary | ICD-10-CM

## 2021-09-06 DIAGNOSIS — M7651 Patellar tendinitis, right knee: Secondary | ICD-10-CM | POA: Diagnosis not present

## 2021-09-06 NOTE — Progress Notes (Signed)
Office Visit Note  Patient: Jeffery Holt             Date of Birth: [birth year]           MRN: [id number]             PCP: Janith Lima, MD Referring: Janith Lima, MD Visit Date: 09/06/2021 Occupation: _0 @  Subjective:  Medication management.   History of Present Illness: Jeffery Muns is a 36 y.o. male with a history of a spondyloarthropathy, and uveitis.  According to the patient he continues to have intermittent flare in his eyes.  He states Dr. Coralyn Pear was not too concerned and advised him to use eyedrops on as needed basis.  He has not noticed any discomfort in the SI joints recently.  He denies any patellar tendon discomfort.  He states he has some discomfort in the bilateral trochanteric bursa.  He denies any discomfort in his lower back.  He was placed on Humira on July 13, 2021.  He is tolerating Humira without any side effects.  Activities of Daily Living:  Patient reports morning stiffness for a few minutes.   Patient Denies nocturnal pain.  Difficulty dressing/grooming: Denies Difficulty climbing stairs: Denies Difficulty getting out of chair: Denies Difficulty using hands for taps, buttons, cutlery, and/or writing: Denies  Review of Systems  Constitutional:  Negative for fatigue.  HENT:  Negative for mouth sores, mouth dryness and nose dryness.   Eyes:  Positive for redness. Negative for pain, itching and dryness.  Respiratory:  Negative for shortness of breath and difficulty breathing.   Cardiovascular:  Negative for chest pain and palpitations.  Gastrointestinal:  Negative for blood in stool, constipation and diarrhea.  Endocrine: Negative for increased urination.  Genitourinary:  Negative for difficulty urinating.  Musculoskeletal:  Positive for morning stiffness. Negative for joint pain, joint pain, joint swelling, myalgias, muscle tenderness and myalgias.  Skin:  Negative for color change, rash and redness.  Allergic/Immunologic: Negative for susceptible  to infections.  Neurological:  Negative for dizziness, numbness, headaches, memory loss and weakness.  Hematological:  Negative for bruising/bleeding tendency.  Psychiatric/Behavioral:  Negative for confusion.    PMFS History:  Patient Active Problem List   Diagnosis Date Noted   GAD (generalized anxiety disorder) 03/29/2021   Uveitis, intermediate, bilateral 03/29/2021   Bradycardia 03/29/2021   Routine general medical examination at a health care facility 11/17/2019   Patellar tendonitis of both knees 03/14/2017   Allergic rhinitis 07/12/2014   GERD (gastroesophageal reflux disease) 01/23/2013    Past Medical History:  Diagnosis Date   Allergy    GERD (gastroesophageal reflux disease)    Petit mal ([place])    as an infant    Uveitis    per patient, dx by Dr. Katy Fitch     Family History  Problem Relation Age of Onset   Rheum arthritis Mother    Parkinson's disease Mother    Arthritis/Rheumatoid Mother    Tremor Mother    Kidney Stones Father    Arthritis Maternal Grandmother    Rheum arthritis Paternal Grandmother    Alcohol abuse Neg Hx    Cancer Neg Hx    COPD Neg Hx    Depression Neg Hx    Diabetes Neg Hx    Drug abuse Neg Hx    Early death Neg Hx    Hearing loss Neg Hx    Heart disease Neg Hx    Hyperlipidemia Neg Hx    Hypertension Neg Hx  Kidney disease Neg Hx    Stroke Neg Hx    Colon cancer Neg Hx    Past Surgical History:  Procedure Laterality Date   COLONOSCOPY  06/12/2021   Tooth implant Left 10/14/2014   Social History   Social History Narrative   Not on file   Immunization History  Administered Date(s) Administered   Influenza Whole 06/22/2014   Influenza, Seasonal, Injecte, Preservative Fre 08/10/2013   Influenza,inj,Quad PF,6+ Mos 06/26/2016, 07/02/2017, 05/27/2018, 05/19/2019, 06/28/2020   Moderna Sars-Covid-2 Vaccination 10/30/2019, 11/27/2019   PFIZER(Purple Top)SARS-COV-2 Vaccination 07/29/2020   Tdap 02/11/2015      Objective: Vital Signs: BP 131/83 (BP Location: Left Arm, Patient Position: Sitting, Cuff Size: Normal)    Pulse 60    Ht _0  (1.88 m)    Wt 233 lb 6.4 oz (105.9 kg)    BMI 29.97 kg/m    Physical Exam Vitals and nursing note reviewed.  Constitutional:      Appearance: He is well-developed.  HENT:     Head: Normocephalic and atraumatic.  Eyes:     Conjunctiva/sclera: Conjunctivae normal.     Pupils: Pupils are equal, round, and reactive to light.  Cardiovascular:     Rate and Rhythm: Normal rate and regular rhythm.     Heart sounds: Normal heart sounds.  Pulmonary:     Effort: Pulmonary effort is normal.     Breath sounds: Normal breath sounds.  Abdominal:     General: Bowel sounds are normal.     Palpations: Abdomen is soft.  Musculoskeletal:     Cervical back: Normal range of motion and neck supple.  Skin:    General: Skin is warm and dry.     Capillary Refill: Capillary refill takes less than 2 seconds.  Neurological:     Mental Status: He is alert and oriented to person, place, and time.  Psychiatric:        Behavior: Behavior normal.     Musculoskeletal Exam: C-spine was in good range of motion.  He had no tenderness over thoracic or lumbar spine.  There was no tenderness over SI joints.  Shoulder joints, elbow joints, wrist joints, MCPs PIPs and DIPs with good range of motion with no synovitis.  Hip joints with good range of motion.  He had tenderness over bilateral trochanteric bursa.  Knee joints, ankles, MTPs with good range of motion.  He had no tenderness over Achilles tendon or plantar fascial.  CDAI Exam: CDAI Score: -- Patient Global: --; Provider Global: -- Swollen: --; Tender: -- Joint Exam 09/06/2021   No joint exam has been documented for this visit   There is currently no information documented on the homunculus. Go to the Rheumatology activity and complete the homunculus joint exam.  Investigation: No additional findings.  Imaging: No results  found.  Recent Labs: Lab Results  Component Value Date   WBC 6.2 08/18/2021   HGB 15.0 08/18/2021   PLT 233 08/18/2021   NA 141 08/18/2021   K 4.4 08/18/2021   CL 105 08/18/2021   CO2 29 08/18/2021   GLUCOSE 77 08/18/2021   BUN 15 08/18/2021   CREATININE 1.10 08/18/2021   BILITOT 0.8 08/18/2021   ALKPHOS 54 11/17/2019   AST 28 08/18/2021   ALT 29 08/18/2021   PROT 6.6 08/18/2021   ALBUMIN 4.4 11/17/2019   CALCIUM 9.2 08/18/2021   GFRAA >89 09/22/2015   QFTBGOLDPLUS NEGATIVE 08/18/2021    Speciality Comments: Humira started July 13, 2021  Procedures:  No procedures performed Allergies: Patient has no known allergies.   Assessment / Plan:     Visit Diagnoses: Uveitis - HLA-B27 positive, history of recurrent uveitis.  Followed by Dr. Coralyn Pear.  I reviewed records from June 05, 2021.  Patient states that Dr. Coralyn Pear felt that his symptoms are stable on as needed use of Pred forte eyedrops.  He will call for follow-up on as needed basis.  Spondyloarthropathy-he has noticed improvement in his lumbar spine discomfort.  High risk medication use - Humira was a started on July 13, 2021, 40 mg subcu every other week.  Labs obtained on August 18, 2021 showed normal CBC and CMP with GFR.  TB gold was negative on August 18, 2021.  Information about immunization was placed in the AVS.  He was advised to hold Humira in case he develops an infection and resume after the infection resolves.  He was evaluated Dr. Pearline Cables (dermatologist) recently.  I reviewed dermatology records.  He was advised annual skin examination to screen for skin cancer.  Nancy Fetter protection was also advised.  Patellar tendonitis of both knees-improved.  Sacroiliitis (HCC) - History of chronic SI joint pain.  X-ray showed bilateral SI joint sclerosis.  He denies any SI joint pain now.  Chronic midline low back pain without sciatica -  X-rays of the lumbar spine were unremarkable.  Ileitis-he is currently not  having any discomfort.  He had some tenderness over bilateral trochanteric bursa.  IT band stretches were emphasized.  HLA B27 positive  History of gastroesophageal reflux (GERD)-he has noticed improvement in his symptoms after modifying his diet.  Seasonal allergic rhinitis due to other allergic trigger  Dyslipidemia  Orders: No orders of the defined types were placed in this encounter.  No orders of the defined types were placed in this encounter.    Follow-Up Instructions: Return in about 3 months (around 12/05/2021) for Spondyloarthropathy, Uveitis.   Bo Merino, MD  Note - This record has been created using Editor, commissioning.  Chart creation errors have been sought, but may not always  have been located. Such creation errors do not reflect on  the standard of medical care.

## 2021-09-06 NOTE — Patient Instructions (Signed)
Standing Labs °We placed an order today for your standing lab work.  ° °Please have your standing labs drawn in March and every 3 months ° °If possible, please have your labs drawn 2 weeks prior to your appointment so that the provider can discuss your results at your appointment. ° °Please note that you may see your imaging and lab results in MyChart before we have reviewed them. °We may be awaiting multiple results to interpret others before contacting you. °Please allow our office up to 72 hours to thoroughly review all of the results before contacting the office for clarification of your results. ° °We have open lab daily: °Monday through Thursday from 1:30-4:30 PM and Friday from 1:30-4:00 PM °at the office of Dr. Kathelyn Gombos, [facility].   °Please be advised, all patients with office appointments requiring lab work will take precedent over walk-in lab work.  °If possible, please come for your lab work on Monday and Friday afternoons, as you may experience shorter wait times. °The office is located at [address], [address], [place], [place] [address] °No appointment is necessary.   °Labs are drawn by [facility]. Please bring your co-pay at the time of your lab draw.  You may receive a bill from [facility] for your lab work. ° °If you wish to have your labs drawn at another location, please call the office 24 hours in advance to send orders. ° °If you have any questions regarding directions or hours of operation,  °please call [phone].   °As a reminder, please drink plenty of water prior to coming for your lab work. Thanks!  ° °Vaccines °You are taking a medication(s) that can suppress your immune system.  The following immunizations are recommended: °Flu annually °Covid-19  °Td/Tdap (tetanus, diphtheria, pertussis) every 10 years °Pneumonia (Prevnar 15 then Pneumovax 23 at least 1 year apart.  Alternatively, can take Prevnar 20 without needing additional dose) °Shingrix: 2 doses from 4 weeks  to 6 months apart ° °Please check with your PCP to make sure you are up to date.  ° °If you have signs or symptoms of an infection or start antibiotics: °First, call your PCP for workup of your infection. °Hold your medication through the infection, until you complete your antibiotics, and until symptoms resolve if you take the following: °Injectable medication (Actemra, Benlysta, Cimzia, Cosentyx, Enbrel, Humira, Kevzara, Orencia, Remicade, Simponi, Stelara, Taltz, Tremfya) °Methotrexate °Leflunomide (Arava) °Mycophenolate (Cellcept) °Xeljanz, Olumiant, or Rinvoq  °

## 2021-09-06 NOTE — Telephone Encounter (Signed)
Please call patient, he reported an insurance change today at his appointment.

## 2021-09-07 NOTE — Telephone Encounter (Addendum)
Called patient for new insurance infomration  [employer] ID: [id number]  BIN: [id number] PCN: [id number] Group: [id number]  Provider help desk: [phone]  He states he has one Humira pen left - is due for the dose next week  Pharmacy team will renew auth through his new plan. Patient advised that we will reach out if he has to change plans.  Submitted a Prior Authorization request to  [employer]  for HUMIRA via CoverMyMeds. Will update once we receive a response.  Key: [id number] Case ID: [id number]  Chesley Mires, PharmD, MPH, BCPS Clinical Pharmacist (Rheumatology and Pulmonology)

## 2021-09-08 ENCOUNTER — Other Ambulatory Visit (HOSPITAL_COMMUNITY): Payer: Self-pay

## 2021-09-08 MED ORDER — HUMIRA (2 PEN) 40 MG/0.4ML ~~LOC~~ AJKT: 40.0000 mg | AUTO-INJECTOR | SUBCUTANEOUS | 0 refills | Status: DC

## 2021-09-08 NOTE — Telephone Encounter (Signed)
Received notification from  [facility]  regarding a prior authorization for [place]. Authorization has been APPROVED from 09/07/21 to 09/07/22.   Patient must fill through  [facility] : [phone]  Authorization # [id number]  Notified patient via [place] - provided with pharmacy phone and copay card information  Knox Saliva, PharmD, MPH, BCPS Clinical Pharmacist (Rheumatology and Pulmonology)

## 2021-09-08 NOTE — Addendum Note (Signed)
Addended by: Cassandria Anger on: 09/08/2021 03:20 PM   Modules accepted: Orders

## 2021-09-20 ENCOUNTER — Telehealth: Payer: Self-pay | Admitting: Pharmacist

## 2021-09-20 NOTE — Telephone Encounter (Signed)
Received fax from [employer]. Humira is on formulary and does not require PA. No further action needed  Chesley Mires, PharmD, MPH, BCPS Clinical Pharmacist (Rheumatology and Pulmonology)

## 2021-09-20 NOTE — Telephone Encounter (Signed)
Submitted a Prior Authorization request to  [employer]  for HUMIRA via CoverMyMeds. Will update once we receive a response.  Key: [id number]  PA submitted for 84 day supply. Previous auth on file for 28 day supply  Chesley Mires, PharmD, MPH, BCPS Clinical Pharmacist (Rheumatology and Pulmonology)

## 2021-10-17 ENCOUNTER — Other Ambulatory Visit: Payer: Self-pay | Admitting: Internal Medicine

## 2021-10-17 DIAGNOSIS — F411 Generalized anxiety disorder: Secondary | ICD-10-CM

## 2021-11-21 NOTE — Progress Notes (Signed)
Office Visit Note  Patient: Jeffery Holt             Date of Birth: 1986/04/05           MRN: [id number]             PCP: [name], MD Referring: [name], MD Visit Date: 12/05/2021 Occupation: @GUAROCC @  Subjective:  Right eye redness   History of Present Illness: Jeffery Holt is a 36 y.o. male with history of uveitis and spondyloarthropathy.  He is on humira 40 mg sq injections every 14 days. He was started on humira on 07/13/21 and has been tolerating it without any side effects or injection site reactions.  He has not missed any doses recently and denies any recent infections.  He states he continues to have intermittent bouts of eye pain and redness.  He currently has redness in the right eye.  He has not seen Dr. Vanessa Barbara recently but plans to schedule an appointment.  He uses prednisolone acetate drops as needed.   He denies any increased joint pain or joint swelling since his last office visit.  He experiences intermittent stiffness in his lower back and hips and tries to stretch at least 4 days a week and uses a foam roller as needed.  He has not experienced any SI joint discomfort at this time.  He has not had any nocturnal pain.  He denies any joint swelling at this time.  He denies any Achilles tendinitis, plantar fasciitis, or patellar tendinitis.  He tries to remain active jogging and weight training.  He denies any recent infections.    Activities of Daily Living:  Patient reports morning stiffness for 20 minutes.   Patient Denies nocturnal pain.  Difficulty dressing/grooming: Denies Difficulty climbing stairs: Denies Difficulty getting out of chair: Denies Difficulty using hands for taps, buttons, cutlery, and/or writing: Denies  Review of Systems  Constitutional:  Negative for fatigue.  HENT:  Positive for mouth dryness and nose dryness. Negative for mouth sores.   Eyes:  Negative for pain, itching and dryness.  Respiratory:  Negative for shortness of breath and  difficulty breathing.   Cardiovascular:  Negative for chest pain and palpitations.  Gastrointestinal:  Negative for blood in stool, constipation and diarrhea.  Endocrine: Negative for increased urination.  Genitourinary:  Negative for difficulty urinating.  Musculoskeletal:  Positive for joint pain, joint pain, myalgias, morning stiffness, muscle tenderness and myalgias. Negative for joint swelling.  Skin:  Negative for color change, rash and redness.  Allergic/Immunologic: Negative for susceptible to infections.  Neurological:  Negative for dizziness, numbness, headaches, memory loss and weakness.  Hematological:  Negative for bruising/bleeding tendency.  Psychiatric/Behavioral:  Negative for confusion.    PMFS History:  Patient Active Problem List   Diagnosis Date Noted   GAD (generalized anxiety disorder) 03/29/2021   Uveitis, intermediate, bilateral 03/29/2021   Bradycardia 03/29/2021   Routine general medical examination at a health care facility 11/17/2019   Patellar tendonitis of both knees 03/14/2017   Allergic rhinitis 07/12/2014   GERD (gastroesophageal reflux disease) 01/23/2013    Past Medical History:  Diagnosis Date   Allergy    GERD (gastroesophageal reflux disease)    Petit mal (HCC)    as an infant    Uveitis    per patient, dx by Dr. Dione Booze     Family History  Problem Relation Age of Onset   Rheum arthritis Mother    Parkinson's disease Mother    Arthritis/Rheumatoid  Mother    Tremor Mother    Kidney Stones Father    Arthritis Maternal Grandmother    Rheum arthritis Paternal Grandmother    Alcohol abuse Neg Hx    Cancer Neg Hx    COPD Neg Hx    Depression Neg Hx    Diabetes Neg Hx    Drug abuse Neg Hx    Early death Neg Hx    Hearing loss Neg Hx    Heart disease Neg Hx    Hyperlipidemia Neg Hx    Hypertension Neg Hx    Kidney disease Neg Hx    Stroke Neg Hx    Colon cancer Neg Hx    Past Surgical History:  Procedure Laterality Date    COLONOSCOPY  06/12/2021   Tooth implant Left 10/14/2014   Social History   Social History Narrative   Not on file   Immunization History  Administered Date(s) Administered   Influenza Whole 06/22/2014   Influenza, Seasonal, Injecte, Preservative Fre 08/10/2013   Influenza,inj,Quad PF,6+ Mos 06/26/2016, 07/02/2017, 05/27/2018, 05/19/2019, 06/28/2020   Moderna Sars-Covid-2 Vaccination 10/30/2019, 11/27/2019   PFIZER(Purple Top)SARS-COV-2 Vaccination 07/29/2020   Tdap 02/11/2015     Objective: Vital Signs: BP 123/79 (BP Location: Left Arm, Patient Position: Sitting, Cuff Size: Normal)   Pulse (!) 59   Ht 6\' 3"  (1.905 m)   Wt 230 lb 9.6 oz (104.6 kg)   BMI 28.82 kg/m    Physical Exam Vitals and nursing note reviewed.  Constitutional:      Appearance: He is well-developed.  HENT:     Head: Normocephalic and atraumatic.  Eyes:     Conjunctiva/sclera: Conjunctivae normal.     Pupils: Pupils are equal, round, and reactive to light.  Cardiovascular:     Rate and Rhythm: Normal rate and regular rhythm.     Heart sounds: Normal heart sounds.  Pulmonary:     Effort: Pulmonary effort is normal.     Breath sounds: Normal breath sounds.  Abdominal:     General: Bowel sounds are normal.     Palpations: Abdomen is soft.  Musculoskeletal:     Cervical back: Normal range of motion and neck supple.  Skin:    General: Skin is warm and dry.     Capillary Refill: Capillary refill takes less than 2 seconds.  Neurological:     Mental Status: He is alert and oriented to person, place, and time.  Psychiatric:        Behavior: Behavior normal.     Musculoskeletal Exam: C-spine, thoracic spine, lumbar spine have good range of motion with no discomfort.  No midline spinal tenderness or SI joint tenderness.  Shoulder joints, elbow joints, wrist joints, MCPs, PIPs, DIPs have good range of motion with no synovitis.  Crepitus in both shoulders noted.  Complete fist formation bilaterally.  Hip  joints have good range of motion with some stiffness bilaterally.  Knee joints have good range of motion with no warmth or effusion.  No evidence of patella tendinitis, Achilles tendinitis, or planter fasciitis.  Ankle joints have good range of motion with no tenderness or joint swelling.  CDAI Exam: CDAI Score: -- Patient Global: --; Provider Global: -- Swollen: --; Tender: -- Joint Exam 12/05/2021   No joint exam has been documented for this visit   There is currently no information documented on the homunculus. Go to the Rheumatology activity and complete the homunculus joint exam.  Investigation: No additional findings.  Imaging: No results found.  Recent Labs: Lab Results  Component Value Date   WBC 6.2 08/18/2021   HGB 15.0 08/18/2021   PLT 233 08/18/2021   NA 141 08/18/2021   K 4.4 08/18/2021   CL 105 08/18/2021   CO2 29 08/18/2021   GLUCOSE 77 08/18/2021   BUN 15 08/18/2021   CREATININE 1.10 08/18/2021   BILITOT 0.8 08/18/2021   ALKPHOS 54 11/17/2019   AST 28 08/18/2021   ALT 29 08/18/2021   PROT 6.6 08/18/2021   ALBUMIN 4.4 11/17/2019   CALCIUM 9.2 08/18/2021   GFRAA >89 09/22/2015   QFTBGOLDPLUS NEGATIVE 08/18/2021    Speciality Comments: Humira started July 13, 2021  Procedures:  No procedures performed Allergies: Patient has no known allergies.   Assessment / Plan:     Visit Diagnoses: Uveitis - HLA-B27 positive, history of recurrent uveitis.  Followed by Dr. Vanessa Barbara: He continues to experience episodic pain and redness in his eyes.  He has been using prednisolone acetate eyedrops as needed for symptomatic relief.  He typically uses the prednisolone eyedrops 2-3 times per week to manage his symptoms.  He has not followed back up with Dr. Vanessa Barbara since October 2022.  He remains on Humira 40 mg subcutaneous injections every 14 days.  He has noticed minimal improvement in his symptoms since initiating Humira on 07/13/2021.  He was advised to schedule  appointment with Dr. Vanessa Barbara or Dr. Clelia Croft for further evaluation to detect if he has active disease.  Discussed that if he has active inflammation he will require combination therapy with methotrexate or we will need to switch to another TNF inhibitor.  He voiced understanding.  He will remain on humira at this time. He will follow-up in the office in 3 months or sooner if needed.  Spondyloarthropathy: He has no synovitis or dactylitis on examination today.  No midline spinal tenderness or SI joint discomfort was noted on examination today.  He has no evidence of patella tendinitis, Achilles tendinitis, or planter fasciitis.  He remains active weight training and jogging up to 4 days a week for exercise.  He has not had any nocturnal pain.  His morning stiffness has been lasting about 20 minutes daily.  Overall his disease seems well controlled on Humira 40 mg sq injections every 14 days.  No medication changes will be made at this time.  He was advised to notify us if he develops signs or symptoms of a flare.  High risk medication use - Humira 40 mg sq injections every 14 days-initiated on 07/13/21. CBC and CMP updated on 08/18/22. He is due to update lab work.  Orders for CBC and CMP released. His next lab work will be due in July and every 3 months. - Plan: CBC with Differential/Platelet, COMPLETE METABOLIC PANEL WITH GFR TB gold negative on 08/18/22.   He has not had any recent infections. Discussed the importance of holding humira if he develops signs or symptoms of an infection and to resume once the infection has completely cleared.  Discussed the importance of yearly skin examinations while on Humira due to the increased risk for nonmelanoma skin cancers.  Patellar tendonitis of both knees: Resolved.  No tenderness or inflammation was noted.   Sacroiliitis (HCC) - History of chronic SI joint pain.  X-ray showed bilateral SI joint sclerosis. No SI joint tenderness noted on examination.   Chronic  midline low back pain without sciatica - X-rays of the lumbar spine were unremarkable.  No midline spinal tenderness.  He is not  experiencing any nocturnal pain or symptoms of sciatica.   Ileitis: Not currently active.   HLA B27 positive  Other medical conditions are listed as follows:   History of gastroesophageal reflux (GERD)  Seasonal allergic rhinitis due to other allergic trigger  Dyslipidemia  Orders: Orders Placed This Encounter  Procedures   CBC with Differential/Platelet   COMPLETE METABOLIC PANEL WITH GFR   No orders of the defined types were placed in this encounter.     Follow-Up Instructions: Return in about 5 months (around 05/07/2022) for Uveitis, Spondyloarthropathy.   Gearldine Bienenstock, PA-C  Note - This record has been created using Dragon software.  Chart creation errors have been sought, but may not always  have been located. Such creation errors do not reflect on  the standard of medical care.

## 2021-12-05 ENCOUNTER — Encounter: Payer: Self-pay | Admitting: Physician Assistant

## 2021-12-05 ENCOUNTER — Ambulatory Visit: Payer: 59 | Admitting: Physician Assistant

## 2021-12-05 VITALS — BP 123/79 | HR 59 | Ht 75.0 in | Wt 230.6 lb

## 2021-12-05 DIAGNOSIS — J3089 Other allergic rhinitis: Secondary | ICD-10-CM

## 2021-12-05 DIAGNOSIS — M7651 Patellar tendinitis, right knee: Secondary | ICD-10-CM | POA: Diagnosis not present

## 2021-12-05 DIAGNOSIS — Z79899 Other long term (current) drug therapy: Secondary | ICD-10-CM

## 2021-12-05 DIAGNOSIS — H209 Unspecified iridocyclitis: Secondary | ICD-10-CM

## 2021-12-05 DIAGNOSIS — Z1589 Genetic susceptibility to other disease: Secondary | ICD-10-CM

## 2021-12-05 DIAGNOSIS — M7652 Patellar tendinitis, left knee: Secondary | ICD-10-CM

## 2021-12-05 DIAGNOSIS — M545 Low back pain, unspecified: Secondary | ICD-10-CM

## 2021-12-05 DIAGNOSIS — M47819 Spondylosis without myelopathy or radiculopathy, site unspecified: Secondary | ICD-10-CM | POA: Diagnosis not present

## 2021-12-05 DIAGNOSIS — E785 Hyperlipidemia, unspecified: Secondary | ICD-10-CM

## 2021-12-05 DIAGNOSIS — M461 Sacroiliitis, not elsewhere classified: Secondary | ICD-10-CM

## 2021-12-05 DIAGNOSIS — K529 Noninfective gastroenteritis and colitis, unspecified: Secondary | ICD-10-CM

## 2021-12-05 DIAGNOSIS — Z8719 Personal history of other diseases of the digestive system: Secondary | ICD-10-CM

## 2021-12-05 DIAGNOSIS — G8929 Other chronic pain: Secondary | ICD-10-CM

## 2021-12-05 NOTE — Patient Instructions (Signed)
Standing Labs ?We placed an order today for your standing lab work.  ? ?Please have your standing labs drawn in July and every 3 months  ? ?If possible, please have your labs drawn 2 weeks prior to your appointment so that the provider can discuss your results at your appointment. ? ?Please note that you may see your imaging and lab results in MyChart before we have reviewed them. ?We may be awaiting multiple results to interpret others before contacting you. ?Please allow our office up to 72 hours to thoroughly review all of the results before contacting the office for clarification of your results. ? ?We have open lab daily: ?Monday through Thursday from 1:30-4:30 PM and Friday from 1:30-4:00 PM ?at the office of Dr. Shaili Deveshwar, [facility].   ?Please be advised, all patients with office appointments requiring lab work will take precedent over walk-in lab work.  ?If possible, please come for your lab work on Monday and Friday afternoons, as you may experience shorter wait times. ?The office is located at [address], [address], [place], [place] [address] ?No appointment is necessary.   ?Labs are drawn by [facility]. Please bring your co-pay at the time of your lab draw.  You may receive a bill from [facility] for your lab work. ? ?Please note if you are on Hydroxychloroquine and and an order has been placed for a Hydroxychloroquine level, you will need to have it drawn 4 hours or more after your last dose. ? ?If you wish to have your labs drawn at another location, please call the office 24 hours in advance to send orders. ? ?If you have any questions regarding directions or hours of operation,  ?please call [phone].   ?As a reminder, please drink plenty of water prior to coming for your lab work. Thanks! ? ?

## 2021-12-06 LAB — COMPLETE METABOLIC PANEL WITH GFR
AG Ratio: 2.1 (calc) (ref 1.0–2.5)
ALT: 18 U/L (ref 9–46)
AST: 22 U/L (ref 10–40)
Albumin: 4.6 g/dL (ref 3.6–5.1)
Alkaline phosphatase (APISO): 40 U/L (ref 36–130)
BUN/Creatinine Ratio: 10 (calc) (ref 6–22)
BUN: 13 mg/dL (ref 7–25)
CO2: 31 mmol/L (ref 20–32)
Calcium: 9.4 mg/dL (ref 8.6–10.3)
Chloride: 105 mmol/L (ref 98–110)
Creat: 1.36 mg/dL — ABNORMAL HIGH (ref 0.60–1.26)
Globulin: 2.2 g/dL (calc) (ref 1.9–3.7)
Glucose, Bld: 81 mg/dL (ref 65–99)
Potassium: 4.2 mmol/L (ref 3.5–5.3)
Sodium: 143 mmol/L (ref 135–146)
Total Bilirubin: 0.7 mg/dL (ref 0.2–1.2)
Total Protein: 6.8 g/dL (ref 6.1–8.1)
eGFR: 69 mL/min/{1.73_m2} (ref >=60)

## 2021-12-06 LAB — CBC WITH DIFFERENTIAL/PLATELET
Absolute Monocytes: 657 cells/uL (ref 200–950)
Basophils Absolute: 20 cells/uL (ref 0–200)
Basophils Relative: 0.3 %
Eosinophils Absolute: 143 cells/uL (ref 15–500)
Eosinophils Relative: 2.2 %
HCT: 43.9 % (ref 38.5–50.0)
Hemoglobin: 14.8 g/dL (ref 13.2–17.1)
Lymphs Abs: 2113 cells/uL (ref 850–3900)
MCH: 32.2 pg (ref 27.0–33.0)
MCHC: 33.7 g/dL (ref 32.0–36.0)
MCV: 95.4 fL (ref 80.0–100.0)
MPV: 9.6 fL (ref 7.5–12.5)
Monocytes Relative: 10.1 %
Neutro Abs: 3569 cells/uL (ref 1500–7800)
Neutrophils Relative %: 54.9 %
Platelets: 216 10*3/uL (ref 140–400)
RBC: 4.6 10*6/uL (ref 4.20–5.80)
RDW: 12.5 % (ref 11.0–15.0)
Total Lymphocyte: 32.5 %
WBC: 6.5 10*3/uL (ref 3.8–10.8)

## 2021-12-06 NOTE — Progress Notes (Signed)
CBC WNL. Creatinine is elevated-1.36. GFR remains WNL but is lower than previous results.  Please advise the patient to avoid the use of NSAIDs and remain hydrated.

## 2021-12-20 ENCOUNTER — Other Ambulatory Visit: Payer: Self-pay | Admitting: Rheumatology

## 2021-12-20 DIAGNOSIS — M47819 Spondylosis without myelopathy or radiculopathy, site unspecified: Secondary | ICD-10-CM

## 2021-12-20 DIAGNOSIS — H209 Unspecified iridocyclitis: Secondary | ICD-10-CM

## 2021-12-20 MED ORDER — HUMIRA (2 PEN) 40 MG/0.4ML ~~LOC~~ AJKT: 40.0000 mg | AUTO-INJECTOR | SUBCUTANEOUS | 0 refills | Status: DC

## 2021-12-20 NOTE — Telephone Encounter (Signed)
Patient states he will check his schedule and call back to schedule follow up.  ?

## 2021-12-20 NOTE — Telephone Encounter (Signed)
Next Visit: Due September 2023. Message sent to the front to schedule patient  ? ?Last Visit: 12/05/2021 ? ?Last Fill: 09/08/2021 ? ?[id number] ? ?Current Dose per office note 12/05/2021: Humira 40 mg sq injections every 14 days ? ?Labs: 12/05/2021 CBC WNL. Creatinine is elevated-1.36. GFR remains WNL but is lower than previous results.  ? ?TB Gold: 08/18/2021 Neg   ? ?Okay to refill Humira?  ?

## 2021-12-20 NOTE — Telephone Encounter (Signed)
Please schedule patient for a follow up visit. Patient due September 2023. Thanks!  

## 2021-12-21 ENCOUNTER — Telehealth: Payer: Self-pay | Admitting: Pharmacist

## 2021-12-21 NOTE — Telephone Encounter (Signed)
Received fax from [employer] stating patient's prior auth for Humira will expire soon ? ?Submitted a Prior Authorization request to  [employer]  for HUMIRA via CoverMyMeds. Will update once we receive a response. ? ?Key: [id number] ? ?Per automated response, prior Authorization Team is unable to review this request for prior authorization as the medication has been previously approved. This approval will expire 09/07/2022. No further action is needed at this time. Updated [employer] portal ? ?Chesley Mires, PharmD, MPH, BCPS ?Clinical Pharmacist (Rheumatology and Pulmonology) ? ?

## 2022-03-15 ENCOUNTER — Other Ambulatory Visit: Payer: Self-pay | Admitting: Physician Assistant

## 2022-03-15 DIAGNOSIS — H209 Unspecified iridocyclitis: Secondary | ICD-10-CM

## 2022-03-15 DIAGNOSIS — M47819 Spondylosis without myelopathy or radiculopathy, site unspecified: Secondary | ICD-10-CM

## 2022-03-15 NOTE — Telephone Encounter (Signed)
Next Visit: 05/10/2022   Last Visit: 12/05/2021   Last Fill: 12/20/2021   [id number]   Current Dose per office note 12/05/2021: Humira 40 mg sq injections every 14 days   Labs: 12/05/2021 CBC WNL. Creatinine is elevated-1.36. GFR remains WNL but is lower than previous results.    TB Gold: 08/18/2021 Neg    Patient advised he is due to update labs. Patient states he will update them tomorrow.    Okay to refill Humira?

## 2022-03-19 ENCOUNTER — Other Ambulatory Visit: Payer: Self-pay | Admitting: Physician Assistant

## 2022-03-19 ENCOUNTER — Other Ambulatory Visit: Payer: Self-pay | Admitting: *Deleted

## 2022-03-19 DIAGNOSIS — H209 Unspecified iridocyclitis: Secondary | ICD-10-CM

## 2022-03-19 DIAGNOSIS — Z79899 Other long term (current) drug therapy: Secondary | ICD-10-CM

## 2022-03-19 DIAGNOSIS — M47819 Spondylosis without myelopathy or radiculopathy, site unspecified: Secondary | ICD-10-CM

## 2022-03-20 LAB — CBC WITH DIFFERENTIAL/PLATELET
Absolute Monocytes: 422 cells/uL (ref 200–950)
Basophils Absolute: 20 cells/uL (ref 0–200)
Basophils Relative: 0.3 %
Eosinophils Absolute: 112 cells/uL (ref 15–500)
Eosinophils Relative: 1.7 %
HCT: 44.4 % (ref 38.5–50.0)
Hemoglobin: 15 g/dL (ref 13.2–17.1)
Lymphs Abs: 2237 cells/uL (ref 850–3900)
MCH: 32.7 pg (ref 27.0–33.0)
MCHC: 33.8 g/dL (ref 32.0–36.0)
MCV: 96.7 fL (ref 80.0–100.0)
MPV: 10 fL (ref 7.5–12.5)
Monocytes Relative: 6.4 %
Neutro Abs: 3808 cells/uL (ref 1500–7800)
Neutrophils Relative %: 57.7 %
Platelets: 227 10*3/uL (ref 140–400)
RBC: 4.59 10*6/uL (ref 4.20–5.80)
RDW: 13.1 % (ref 11.0–15.0)
Total Lymphocyte: 33.9 %
WBC: 6.6 10*3/uL (ref 3.8–10.8)

## 2022-03-20 LAB — COMPLETE METABOLIC PANEL WITH GFR
AG Ratio: 2.6 (calc) — ABNORMAL HIGH (ref 1.0–2.5)
ALT: 29 U/L (ref 9–46)
AST: 28 U/L (ref 10–40)
Albumin: 5.1 g/dL (ref 3.6–5.1)
Alkaline phosphatase (APISO): 40 U/L (ref 36–130)
BUN/Creatinine Ratio: 13 (calc) (ref 6–22)
BUN: 17 mg/dL (ref 7–25)
CO2: 26 mmol/L (ref 20–32)
Calcium: 9.4 mg/dL (ref 8.6–10.3)
Chloride: 105 mmol/L (ref 98–110)
Creat: 1.33 mg/dL — ABNORMAL HIGH (ref 0.60–1.26)
Globulin: 2 g/dL (calc) (ref 1.9–3.7)
Glucose, Bld: 88 mg/dL (ref 65–139)
Potassium: 4.4 mmol/L (ref 3.5–5.3)
Sodium: 141 mmol/L (ref 135–146)
Total Bilirubin: 0.7 mg/dL (ref 0.2–1.2)
Total Protein: 7.1 g/dL (ref 6.1–8.1)
eGFR: 70 mL/min/{1.73_m2} (ref >=60)

## 2022-03-20 NOTE — Progress Notes (Signed)
Creatinine is mildly elevated.  CBC is normal.  Please advise patient to avoid NSAIDs and increase water intake.

## 2022-03-27 ENCOUNTER — Other Ambulatory Visit: Payer: Self-pay | Admitting: Physician Assistant

## 2022-03-27 DIAGNOSIS — M47819 Spondylosis without myelopathy or radiculopathy, site unspecified: Secondary | ICD-10-CM

## 2022-03-27 DIAGNOSIS — H209 Unspecified iridocyclitis: Secondary | ICD-10-CM

## 2022-03-27 NOTE — Telephone Encounter (Signed)
Next Visit: 05/10/2022   Last Visit: 12/05/2021   Last Fill: 03/15/2022 (30 day supply)   [id number]   Current Dose per office note 12/05/2021: Humira 40 mg sq injections every 14 days   Labs: 03/19/2022 Creatinine is mildly elevated.  CBC is normal.   TB Gold: 08/18/2021 Neg     Okay to refill Humira?

## 2022-04-14 IMAGING — CT CT NECK W/ CM
4 of 5 series · 15 of 33 positions shown, 17 images · IV contrast (OMNIPAQUE 300)
Comparison: Neck ultrasound 10/13/2018.

CLINICAL DATA: 34-year-old male with intermittent swelling of the
left neck.

EXAM:
CT NECK WITH CONTRAST
TECHNIQUE: Multidetector CT imaging of the neck was performed using the
standard protocol following the bolus administration of intravenous
contrast.
CONTRAST:  75mL OMNIPAQUE IOHEXOL 300 MG/ML  SOLN

[Series 3: neck 2.0 bf37 2 · axial · 0.45mm/px · z∈[-301,-119]mm · 4 of 153 slices shown, 5 images]
[im 31/153  soft-tissue]
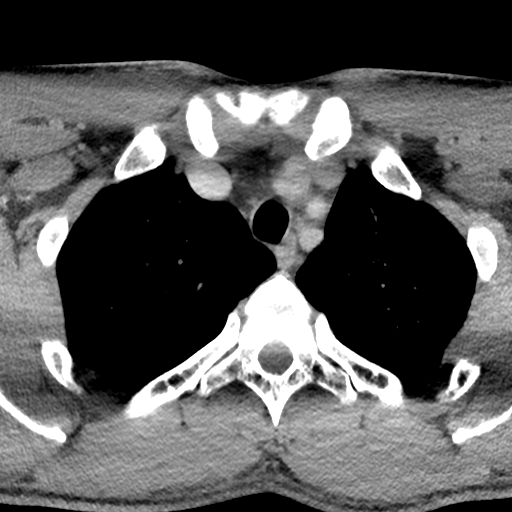
[im 31/153  bone]
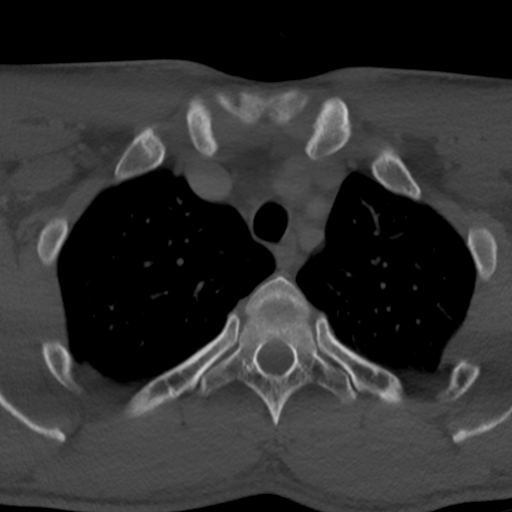
[im 61/153  bone]
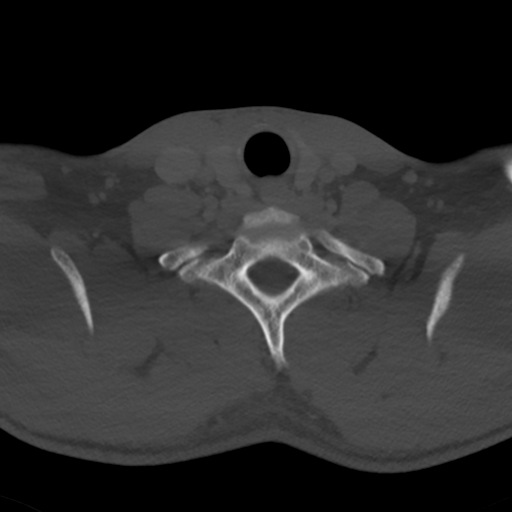
[im 92/153  bone]
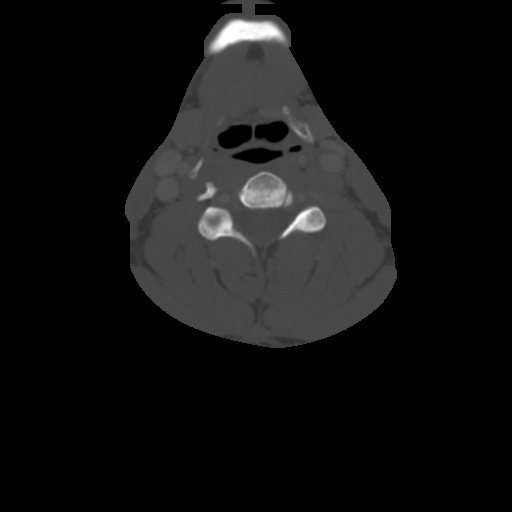
[im 122/153  bone]
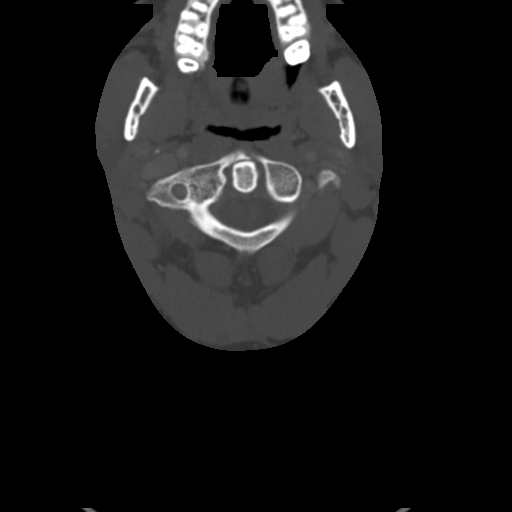

[Series 6: coronal st · coronal · 0.60mm/px · 3 of 98 slices shown]
[im 20/98  bone]
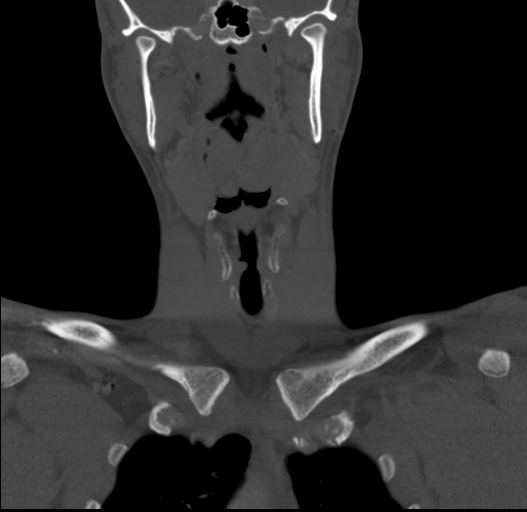
[im 39/98  bone]
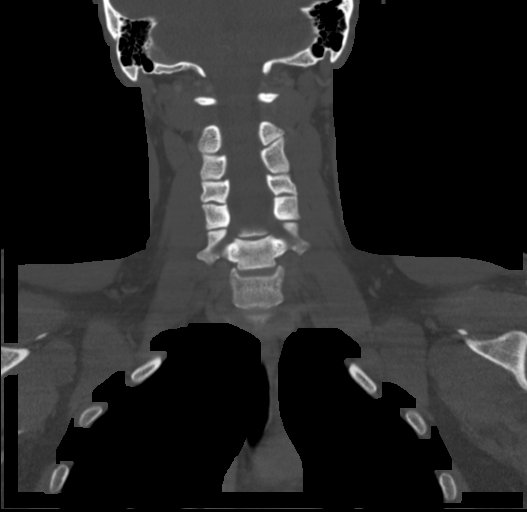
[im 59/98  bone]
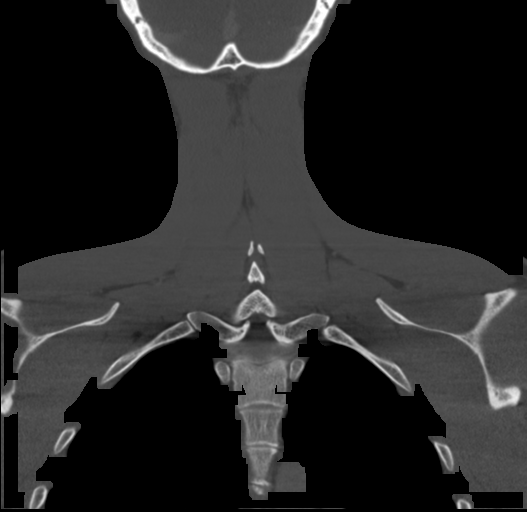

[Series 7: sagittal st · sagittal · 0.49mm/px · 5 of 161 slices shown, 6 images]
[im 54/161  bone]
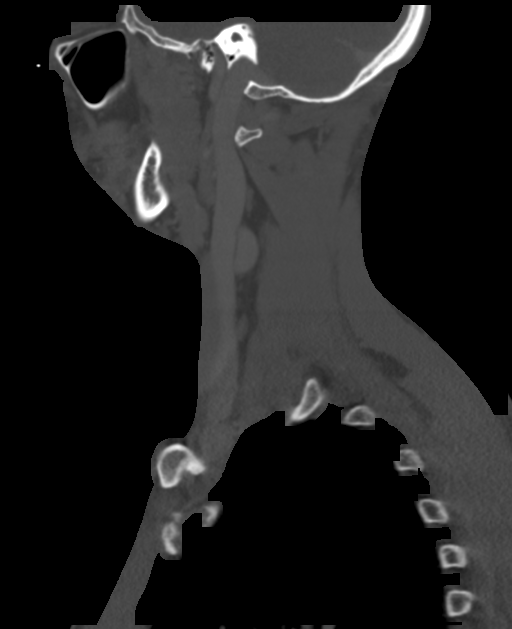
[im 67/161  bone]
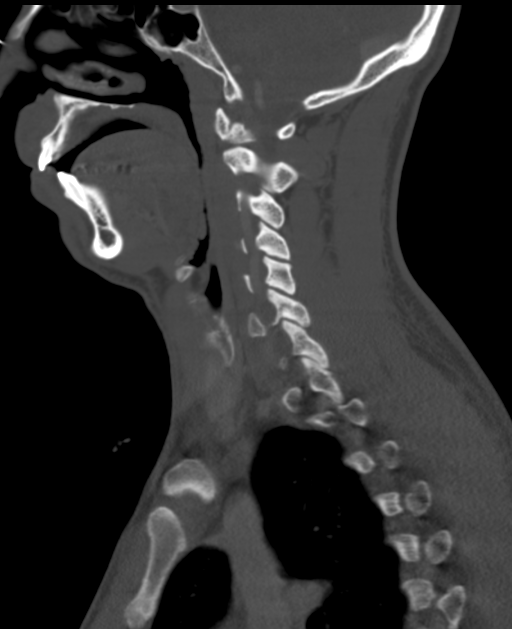
[im 81/161  soft-tissue]
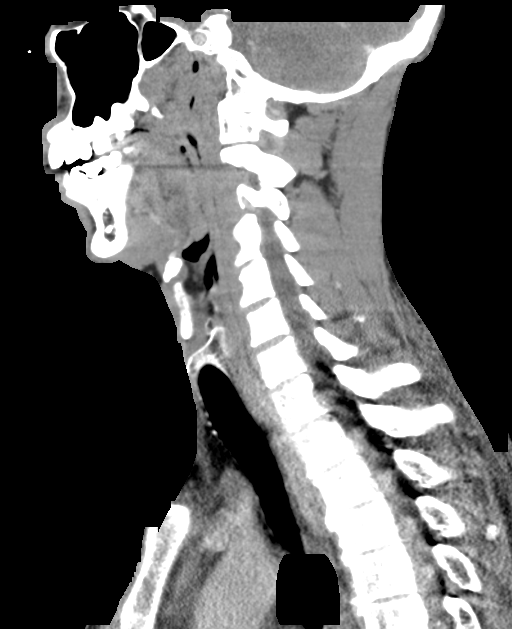
[im 81/161  bone]
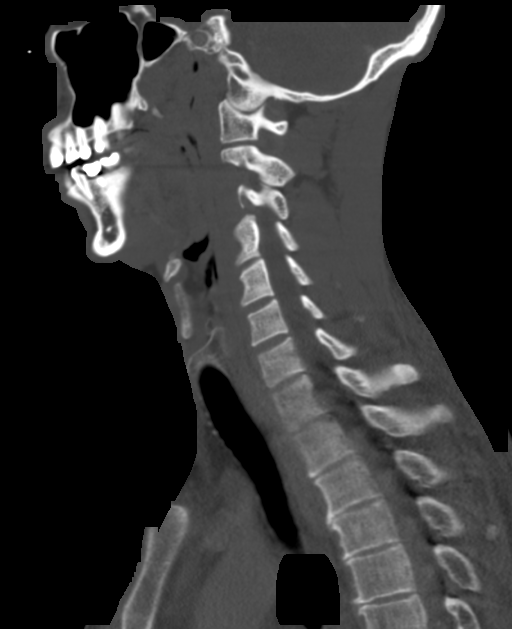
[im 94/161  bone]
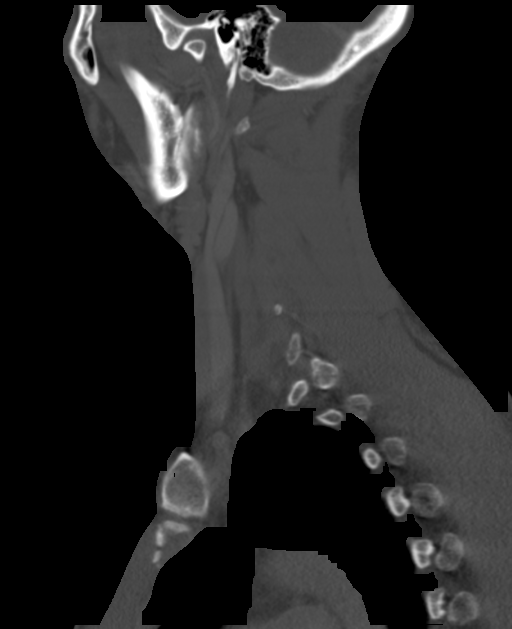
[im 107/161  bone]
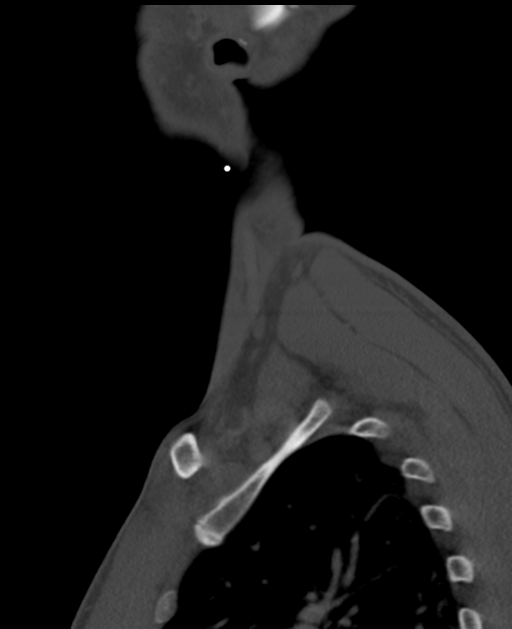

[Series 8: orthogonal · axial · 0.39mm/px · z∈[-322,-204]mm · 3 of 153 slices shown]
[im 31/153  bone]
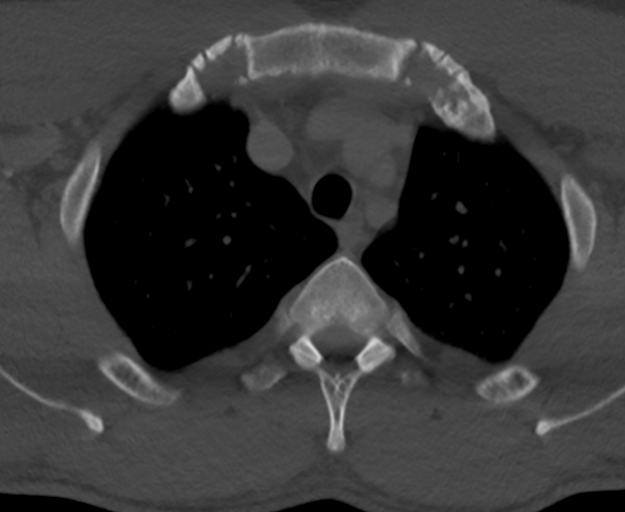
[im 61/153  bone]
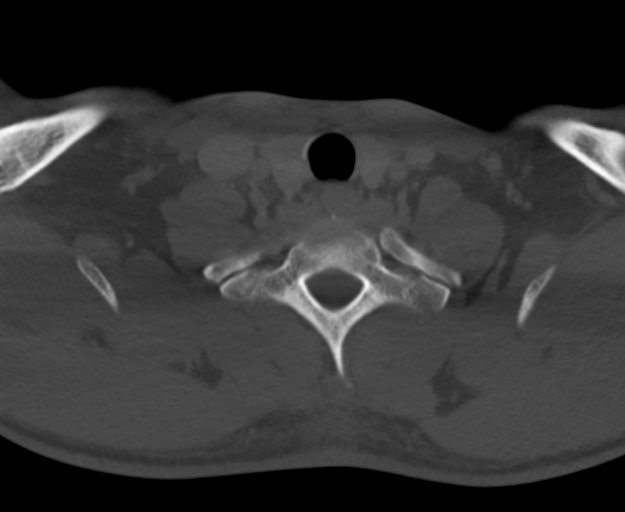
[im 92/153  bone]
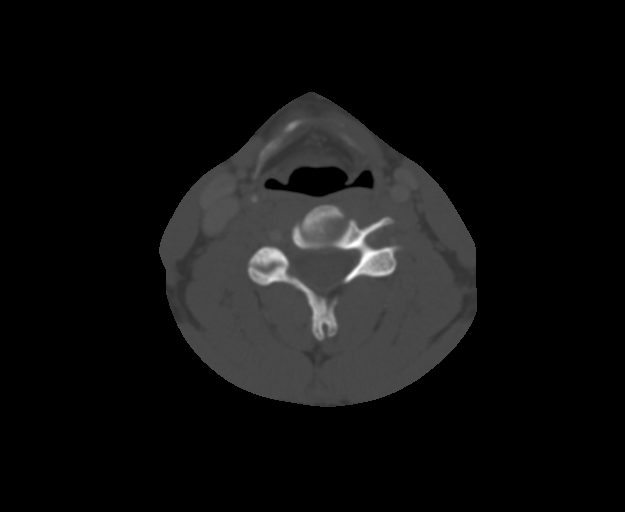

[15 of 33 positions shown; findings below may reference images not displayed]

FINDINGS: Pharynx and larynx: There is mild asymmetry of the larynx (series 3,
image 79) but no laryngeal mass or hyperenhancement. Pharyngeal soft
tissue contours are within normal limits. Negative parapharyngeal
and retropharyngeal spaces.

Salivary glands: Negative sublingual space. Submandibular and
parotid glands are within normal limits.

Thyroid: Negative.

Lymph nodes: There is a marked area of clinical concern along the
left lateral neck on series 3, image 40. Directly beneath the skin
marker is the inferior pole of the left parotid gland which appears
within normal limits (coronal image 27). Just caudal to that level
normal left level 2 lymph nodes measure up to 6 mm short axis
(series 3, image 49).

Contralateral right level 2 lymph nodes also measure up to 6 mm
short axis (sagittal image 50). And other bilateral lymph node
stations also appear symmetric and within normal limits. No
abnormally enlarged or heterogeneous lymph nodes.

Vascular: Major vascular structures in the neck and at the skull
base are patent and appear normal. The right vertebral artery is
mildly dominant (normal variant).

Limited intracranial: Negative.

Visualized orbits: Negative.

Mastoids and visualized paranasal sinuses: Clear.

Skeleton: No osseous abnormality identified.

Upper chest: Negative.
IMPRESSION: Neck soft tissues and cervical lymph nodes are within normal limits.
No mass or abnormality to correspond to the marked area of clinical
concern.
Recommend follow-up by clinical exam, with repeat Neck CT (IV
contrast preferred) if the area further enlarges or becomes painful.

## 2022-04-26 ENCOUNTER — Other Ambulatory Visit: Payer: Self-pay | Admitting: Internal Medicine

## 2022-04-26 DIAGNOSIS — F411 Generalized anxiety disorder: Secondary | ICD-10-CM

## 2022-04-27 NOTE — Progress Notes (Signed)
Office Visit Note  Patient: Jeffery Holt             Date of Birth: [birth year]           MRN: [id number]             PCP: Janith Lima, MD Referring: Janith Lima, MD Visit Date: 05/10/2022 Occupation: @GUAROCC @  Subjective:  Medication management   History of Present Illness: Jeffery Holt is a 36 y.o. male with history of uveitis, spondyloarthropathy.  He states since he has been taking Humira he has noticed improvement in his SI joint pain and lower back pain.  He has some stiffness in the cervical and lumbar spine off-and-on.  He denies any history of [place] fasciitis or Achilles tendinitis.  He states he notices some redness in his eyes but he has not had typical uveitis flares.  He has not seen Dr. Glennis Brink since June 05, 2021.  At the time uveitis was not active.  He states he notices cervical lymph node off-and-on.  He had extensive work-up in the past including CT scan which was negative.  He notices discomfort in his knee joints when he jogs.  Activities of Daily Living:  Patient reports morning stiffness for 20-30 minutes.   Patient Denies nocturnal pain.  Difficulty dressing/grooming: Reports Difficulty climbing stairs: Denies Difficulty getting out of chair: Denies Difficulty using hands for taps, buttons, cutlery, and/or writing: Denies  Review of Systems  Constitutional:  Negative for fatigue.  HENT:  Negative for mouth sores and mouth dryness.   Eyes:  Negative for dryness.  Respiratory:  Negative for shortness of breath.   Cardiovascular:  Negative for chest pain and palpitations.  Gastrointestinal:  Negative for blood in stool, constipation and diarrhea.  Endocrine: Negative for increased urination.  Genitourinary:  Negative for involuntary urination.  Musculoskeletal:  Positive for joint pain, joint pain and morning stiffness. Negative for gait problem, joint swelling, myalgias, muscle weakness, muscle tenderness and myalgias.  Skin:  Negative for color change,  rash, hair loss and sensitivity to sunlight.  Allergic/Immunologic: Negative for susceptible to infections.  Neurological:  Negative for dizziness and headaches.  Hematological:  Positive for swollen glands.  Psychiatric/Behavioral:  Negative for depressed mood and sleep disturbance. The patient is nervous/anxious.     PMFS History:  Patient Active Problem List   Diagnosis Date Noted   GAD (generalized anxiety disorder) 03/29/2021   Uveitis, intermediate, bilateral 03/29/2021   Bradycardia 03/29/2021   Routine general medical examination at a health care facility 11/17/2019   Patellar tendonitis of both knees 03/14/2017   Allergic rhinitis 07/12/2014   GERD (gastroesophageal reflux disease) 01/23/2013    Past Medical History:  Diagnosis Date   Allergy    GERD (gastroesophageal reflux disease)    Petit mal ([place])    as an infant    Uveitis    per patient, dx by Dr. Katy Fitch     Family History  Problem Relation Age of Onset   Rheum arthritis Mother    Parkinson's disease Mother    Arthritis/Rheumatoid Mother    Tremor Mother    Kidney Stones Father    Arthritis Maternal Grandmother    Rheum arthritis Paternal Grandmother    Alcohol abuse Neg Hx    Cancer Neg Hx    COPD Neg Hx    Depression Neg Hx    Diabetes Neg Hx    Drug abuse Neg Hx    Early death Neg Hx    Hearing  loss Neg Hx    Heart disease Neg Hx    Hyperlipidemia Neg Hx    Hypertension Neg Hx    Kidney disease Neg Hx    Stroke Neg Hx    Colon cancer Neg Hx    Past Surgical History:  Procedure Laterality Date   COLONOSCOPY  06/12/2021   Tooth implant Left 10/14/2014   Social History   Social History Narrative   Not on file   Immunization History  Administered Date(s) Administered   Influenza Whole 06/22/2014   Influenza, Seasonal, Injecte, Preservative Fre 08/10/2013   Influenza,inj,Quad PF,6+ Mos 06/26/2016, 07/02/2017, 05/27/2018, 05/19/2019, 06/28/2020   Moderna Sars-Covid-2 Vaccination  10/30/2019, 11/27/2019   PFIZER(Purple Top)SARS-COV-2 Vaccination 07/29/2020   Tdap 02/11/2015     Objective: Vital Signs: BP 136/85 (BP Location: Left Arm, Patient Position: Sitting, Cuff Size: Normal)   Pulse (!) 51   Resp 15   Ht $R'6\' 3"'qY$  (1.905 m)   Wt 227 lb (103 kg)   BMI 28.37 kg/m    Physical Exam Vitals and nursing note reviewed.  Constitutional:      Appearance: He is well-developed.  HENT:     Head: Normocephalic and atraumatic.  Eyes:     Conjunctiva/sclera: Conjunctivae normal.     Pupils: Pupils are equal, round, and reactive to light.  Neck:     Comments: Freely mobile nontender 0.5 x 1 cm lymph node Ro palpable in this left submandibular region. Cardiovascular:     Rate and Rhythm: Normal rate and regular rhythm.     Heart sounds: Normal heart sounds.  Pulmonary:     Effort: Pulmonary effort is normal.     Breath sounds: Normal breath sounds.  Abdominal:     General: Bowel sounds are normal.     Palpations: Abdomen is soft.  Musculoskeletal:     Cervical back: Normal range of motion and neck supple.  Skin:    General: Skin is warm and dry.     Capillary Refill: Capillary refill takes less than 2 seconds.  Neurological:     Mental Status: He is alert and oriented to person, place, and time.  Psychiatric:        Behavior: Behavior normal.      Musculoskeletal Exam: C-spine, thoracic and lumbar spine were in good range of motion.  He had no difficulty reaching his toes.  He had tight hamstrings.  Shoulder joints, elbow joints, wrist joints, MCPs PIPs and DIPs been good range of motion with no synovitis.  Hip joints, knee joints, ankles were in good range of motion.  He had no tenderness or MTPs.  CDAI Exam: CDAI Score: -- Patient Global: --; Provider Global: -- Swollen: --; Tender: -- Joint Exam 05/10/2022   No joint exam has been documented for this visit   There is currently no information documented on the homunculus. Go to the Rheumatology  activity and complete the homunculus joint exam.  Investigation: No additional findings.  Imaging: No results found.  Recent Labs: Lab Results  Component Value Date   WBC 6.6 03/19/2022   HGB 15.0 03/19/2022   PLT 227 03/19/2022   NA 141 03/19/2022   K 4.4 03/19/2022   CL 105 03/19/2022   CO2 26 03/19/2022   GLUCOSE 88 03/19/2022   BUN 17 03/19/2022   CREATININE 1.33 (H) 03/19/2022   BILITOT 0.7 03/19/2022   ALKPHOS 54 11/17/2019   AST 28 03/19/2022   ALT 29 03/19/2022   PROT 7.1 03/19/2022   ALBUMIN 4.4  11/17/2019   CALCIUM 9.4 03/19/2022   GFRAA >89 09/22/2015   QFTBGOLDPLUS NEGATIVE 08/18/2021    Speciality Comments: Humira started July 13, 2021  Procedures:  No procedures performed Allergies: Patient has no known allergies.   Assessment / Plan:     Visit Diagnoses: Uveitis - HLA-B27 positive, history of recurrent uveitis.  Followed by Dr. Coralyn Pear: Patient has not seen Dr. Coralyn Pear since October 2022.  I advised him to schedule a follow-up appointment.  He had mild conjunctival injection which she relates to not sleeping well last night.  He denies any photosensitivity.  Spondyloarthropathy-he has noted improvement in his back symptoms since he has been on Humira.  He notices some stiffness in the neck and lower back off and on.  High risk medication use - Humira 40 mg sq injections every 14 days-initiated on 07/13/21.  He has been taking Humira on a regular basis without any side effects.  Labs obtained on March 19, 2022 were within normal limits except for elevated creatinine.  TB gold was negative on August 18, 2021.  He was advised to get labs in October and every 3 months to monitor for drug toxicity.  We will get TB Gold in  January 2024.  Information regarding immunization was placed in the AVS.  He was also advised to hold Humira if he develops an infection and resume after the infection resolves.  Annual skin examination to screen for skin cancer was advised.   Use of sunscreen and sun protection was discussed.  Elevated serum creatinine-patient believes the elevated creatinine was due to a protein supplement he was taking.  He has stopped taking the supplement.  Patellar tendonitis of both knees -he has no patellar tendinitis although he notices some discomfort in his knee joints when he jogs.  Sacroiliitis ([place]) -he denies any SI joint discomfort now.[place] showed bilateral SI joint sclerosis.  Chronic midline low back pain without sciatica -he is off and on stiffness in his lower back.  His lower back pain has improved.  X-rays of the lumbar spine were unremarkable.  Cervical lymphadenopathy-he had one small palpable left submandibular lymph node.  Patient states this lymph node comes and goes.  He had ENT evaluation in the past and also CT scan of his neck.  I offered referral to ENT or surgery for evaluation.  Patient stated that he will let us know if he wants any further work-up.  Other medical problems are listed as follows:  Ileitis  HLA B27 positive  History of gastroesophageal reflux (GERD)  Dyslipidemia  Seasonal allergic rhinitis due to other allergic trigger  Orders: No orders of the defined types were placed in this encounter.  No orders of the defined types were placed in this encounter.    Follow-Up Instructions: Return in about 5 months (around 10/10/2022) for Spondyloarthropathy.   Bo Merino, MD  Note - This record has been created using Editor, commissioning.  Chart creation errors have been sought, but may not always  have been located. Such creation errors do not reflect on  the standard of medical care.

## 2022-05-10 ENCOUNTER — Ambulatory Visit: Payer: Commercial Managed Care - HMO | Attending: Rheumatology | Admitting: Rheumatology

## 2022-05-10 ENCOUNTER — Encounter: Payer: Self-pay | Admitting: Rheumatology

## 2022-05-10 VITALS — BP 136/85 | HR 51 | Resp 15 | Ht 75.0 in | Wt 227.0 lb

## 2022-05-10 DIAGNOSIS — Z79899 Other long term (current) drug therapy: Secondary | ICD-10-CM

## 2022-05-10 DIAGNOSIS — R7989 Other specified abnormal findings of blood chemistry: Secondary | ICD-10-CM | POA: Diagnosis not present

## 2022-05-10 DIAGNOSIS — M461 Sacroiliitis, not elsewhere classified: Secondary | ICD-10-CM

## 2022-05-10 DIAGNOSIS — Z1589 Genetic susceptibility to other disease: Secondary | ICD-10-CM

## 2022-05-10 DIAGNOSIS — G8929 Other chronic pain: Secondary | ICD-10-CM

## 2022-05-10 DIAGNOSIS — H209 Unspecified iridocyclitis: Secondary | ICD-10-CM

## 2022-05-10 DIAGNOSIS — M47819 Spondylosis without myelopathy or radiculopathy, site unspecified: Secondary | ICD-10-CM

## 2022-05-10 DIAGNOSIS — M545 Low back pain, unspecified: Secondary | ICD-10-CM

## 2022-05-10 DIAGNOSIS — E785 Hyperlipidemia, unspecified: Secondary | ICD-10-CM

## 2022-05-10 DIAGNOSIS — R590 Localized enlarged lymph nodes: Secondary | ICD-10-CM

## 2022-05-10 DIAGNOSIS — M7651 Patellar tendinitis, right knee: Secondary | ICD-10-CM

## 2022-05-10 DIAGNOSIS — Z8719 Personal history of other diseases of the digestive system: Secondary | ICD-10-CM

## 2022-05-10 DIAGNOSIS — M7652 Patellar tendinitis, left knee: Secondary | ICD-10-CM

## 2022-05-10 DIAGNOSIS — K529 Noninfective gastroenteritis and colitis, unspecified: Secondary | ICD-10-CM

## 2022-05-10 DIAGNOSIS — J3089 Other allergic rhinitis: Secondary | ICD-10-CM

## 2022-05-10 NOTE — Patient Instructions (Signed)
Standing Labs We placed an order today for your standing lab work.   Please have your standing labs drawn in October and every 3 months  If possible, please have your labs drawn 2 weeks prior to your appointment so that the provider can discuss your results at your appointment.  Please note that you may see your imaging and lab results in MyChart before we have reviewed them. We may be awaiting multiple results to interpret others before contacting you. Please allow our office up to 72 hours to thoroughly review all of the results before contacting the office for clarification of your results.  We currently have open lab daily: Monday through Thursday from 1:30 PM-4:30 PM and Friday from 1:30 PM- 4:00 PM If possible, please come for your lab work on Monday, Thursday or Friday afternoons, as you may experience shorter wait times.   Effective July 04, 2022 the new lab hours will change to: Monday through Thursday from 1:30 PM-5:00 PM and Friday from 8:30 AM-12:00 PM If possible, please come for your lab work on Monday and Thursday afternoons, as you may experience shorter wait times.  Please be advised, all patients with office appointments requiring lab work will take precedent over walk-in lab work.    The office is located at [address], [address], , [place] [address] No appointment is necessary.   Labs are drawn by [facility]. Please bring your co-pay at the time of your lab draw.  You may receive a bill from [facility] for your lab work.  Please note if you are on Hydroxychloroquine and and an order has been placed for a Hydroxychloroquine level, you will need to have it drawn 4 hours or more after your last dose.  If you wish to have your labs drawn at another location, please call the office 24 hours in advance to send orders.  If you have any questions regarding directions or hours of operation,  please call [phone].   As a reminder, please drink plenty of water prior to  coming for your lab work. Thanks!   Vaccines You are taking a medication(s) that can suppress your immune system.  The following immunizations are recommended: Flu annually Covid-19  Td/Tdap (tetanus, diphtheria, pertussis) every 10 years Pneumonia (Prevnar 15 then Pneumovax 23 at least 1 year apart.  Alternatively, can take Prevnar 20 without needing additional dose) Shingrix: 2 doses from 4 weeks to 6 months apart  Please check with your PCP to make sure you are up to date.   If you have signs or symptoms of an infection or start antibiotics: First, call your PCP for workup of your infection. Hold your medication through the infection, until you complete your antibiotics, and until symptoms resolve if you take the following: Injectable medication (Actemra, Benlysta, Cimzia, Cosentyx, Enbrel, Humira, Kevzara, Orencia, Remicade, Simponi, Stelara, Taltz, Tremfya) Methotrexate Leflunomide (Arava) Mycophenolate (Cellcept) Xeljanz, Olumiant, or Rinvoq  Please get an annual skin examination to screen for skin cancer while you are on Humira 

## 2022-05-15 ENCOUNTER — Encounter: Payer: Self-pay | Admitting: Internal Medicine

## 2022-05-18 ENCOUNTER — Telehealth: Payer: Self-pay | Admitting: Pharmacist

## 2022-05-18 NOTE — Telephone Encounter (Signed)
Patient has had change in insurance since [employer] no longer services [place].  Submitted a Prior Authorization request to [employer] for HUMIRA via CoverMyMeds. Will update once we receive a response.  Key: [id number]  Jeffery Holt, PharmD, MPH, BCPS, CPP Clinical Pharmacist (Rheumatology and Pulmonology)

## 2022-05-21 ENCOUNTER — Encounter: Payer: Self-pay | Admitting: Internal Medicine

## 2022-05-21 ENCOUNTER — Ambulatory Visit (INDEPENDENT_AMBULATORY_CARE_PROVIDER_SITE_OTHER): Payer: Commercial Managed Care - HMO | Admitting: Internal Medicine

## 2022-05-21 VITALS — BP 120/78 | HR 60 | Temp 98.4°F | Ht 75.0 in | Wt 229.0 lb

## 2022-05-21 DIAGNOSIS — Z23 Encounter for immunization: Secondary | ICD-10-CM | POA: Diagnosis not present

## 2022-05-21 DIAGNOSIS — R001 Bradycardia, unspecified: Secondary | ICD-10-CM | POA: Diagnosis not present

## 2022-05-21 DIAGNOSIS — F411 Generalized anxiety disorder: Secondary | ICD-10-CM | POA: Diagnosis not present

## 2022-05-21 DIAGNOSIS — Z0000 Encounter for general adult medical examination without abnormal findings: Secondary | ICD-10-CM | POA: Diagnosis not present

## 2022-05-21 MED ORDER — ESCITALOPRAM OXALATE 5 MG PO TABS: ORAL_TABLET | ORAL | 3 refills | Status: DC

## 2022-05-22 NOTE — Progress Notes (Signed)
Subjective:  Patient ID: Jeffery Holt, male    DOB: Apr 03, 1986  Age: 36 y.o. MRN: [id number]  CC: Annual Exam and Depression   HPI Jeffery Holt presents for a CPX and f/up-   He complains of irritability but otherwise he said his mood and affect are normal.  He would like to stay on the current dose of Lexapro.  He is stopped using the ocular steroids for uveitis.  He is a runner and has good endurance.  He denies dizziness, lightheadedness, shortness of breath, or near syncope.  Outpatient Medications Prior to Visit  Medication Sig Dispense Refill   HUMIRA PEN 40 MG/0.4ML PNKT INJECT 1 PEN SUBCUTANEOUSLY ONCE EVERY 14 DAYS 6 each 0   escitalopram (LEXAPRO) 5 MG tablet TAKE 1 TABLET(5 MG) BY MOUTH DAILY 90 tablet 1   prednisoLONE acetate (PRED FORTE) 1 % ophthalmic suspension Place 1 drop into both eyes 4 times daily. (Patient not taking: Reported on 05/10/2022) 5 mL 0   No facility-administered medications prior to visit.    ROS Review of Systems  Constitutional:  Negative for diaphoresis and fatigue.  HENT: Negative.    Eyes: Negative.   Respiratory: Negative.    Cardiovascular: Negative.  Negative for chest pain.  Gastrointestinal: Negative.  Negative for abdominal pain.  Endocrine: Negative.   Genitourinary: Negative.   Musculoskeletal: Negative.   Skin: Negative.   Hematological:  Negative for adenopathy. Does not bruise/bleed easily.  Psychiatric/Behavioral:  Positive for dysphoric mood. Negative for self-injury and sleep disturbance. The patient is not nervous/anxious.     Objective:  BP 120/78 (BP Location: Left Arm, Patient Position: Sitting, Cuff Size: Normal)   Pulse 60   Temp 98.4 F (36.9 C) (Oral)   Ht 6\' 3"  (1.905 m)   Wt 229 lb (103.9 kg)   SpO2 97%   BMI 28.62 kg/m   BP Readings from Last 3 Encounters:  05/21/22 120/78  05/10/22 136/85  12/05/21 123/79    Wt Readings from Last 3 Encounters:  05/21/22 229 lb (103.9 kg)  05/10/22 227 lb (103 kg)   12/05/21 230 lb 9.6 oz (104.6 kg)    Physical Exam Vitals reviewed.  HENT:     Mouth/Throat:     Mouth: Mucous membranes are moist.  Eyes:     General: No scleral icterus.    Conjunctiva/sclera: Conjunctivae normal.  Cardiovascular:     Rate and Rhythm: Normal rate and regular rhythm.     Heart sounds: No murmur heard. Pulmonary:     Effort: Pulmonary effort is normal.     Breath sounds: No stridor. No wheezing, rhonchi or rales.  Abdominal:     General: Abdomen is flat.     Palpations: There is no mass.     Tenderness: There is no abdominal tenderness. There is no guarding.     Hernia: No hernia is present.  Musculoskeletal:        General: Normal range of motion.     Cervical back: Neck supple.     Right lower leg: No edema.     Left lower leg: No edema.  Lymphadenopathy:     Cervical: No cervical adenopathy.  Skin:    General: Skin is warm and dry.  Neurological:     General: No focal deficit present.     Mental Status: He is alert.  Psychiatric:        Mood and Affect: Mood normal.        Behavior: Behavior normal.  Thought Content: Thought content normal.        Judgment: Judgment normal.     Lab Results  Component Value Date   WBC 6.6 03/19/2022   HGB 15.0 03/19/2022   HCT 44.4 03/19/2022   PLT 227 03/19/2022   GLUCOSE 88 03/19/2022   CHOL 135 03/29/2021   TRIG 100.0 03/29/2021   HDL 33.80 (L) 03/29/2021   LDLDIRECT 72.0 11/17/2019   LDLCALC 81 03/29/2021   ALT 29 03/19/2022   AST 28 03/19/2022   NA 141 03/19/2022   K 4.4 03/19/2022   CL 105 03/19/2022   CREATININE 1.33 (H) 03/19/2022   BUN 17 03/19/2022   CO2 26 03/19/2022   TSH 1.32 03/29/2021    CT Soft Tissue Neck W Contrast  Result Date: 04/11/2020 CLINICAL DATA:  36 year old male with intermittent swelling of the left neck. EXAM: CT NECK WITH CONTRAST TECHNIQUE: Multidetector CT imaging of the neck was performed using the standard protocol following the bolus administration of  intravenous contrast. CONTRAST:  61mL OMNIPAQUE IOHEXOL 300 MG/ML  SOLN COMPARISON:  Neck ultrasound 10/13/2018. FINDINGS: Pharynx and larynx: There is mild asymmetry of the larynx (series 3, image 79) but no laryngeal mass or hyperenhancement. Pharyngeal soft tissue contours are within normal limits. Negative parapharyngeal and retropharyngeal spaces. Salivary glands: Negative sublingual space. Submandibular and parotid glands are within normal limits. Thyroid: Negative. Lymph nodes: There is a marked area of clinical concern along the left lateral neck on series 3, image 40. Directly beneath the skin marker is the inferior pole of the left parotid gland which appears within normal limits (coronal image 27). Just caudal to that level normal left level 2 lymph nodes measure up to 6 mm short axis (series 3, image 49). Contralateral right level 2 lymph nodes also measure up to 6 mm short axis (sagittal image 50). And other bilateral lymph node stations also appear symmetric and within normal limits. No abnormally enlarged or heterogeneous lymph nodes. Vascular: Major vascular structures in the neck and at the skull base are patent and appear normal. The right vertebral artery is mildly dominant (normal variant). Limited intracranial: Negative. Visualized orbits: Negative. Mastoids and visualized paranasal sinuses: Clear. Skeleton: No osseous abnormality identified. Upper chest: Negative. IMPRESSION: Neck soft tissues and cervical lymph nodes are within normal limits. No mass or abnormality to correspond to the marked area of clinical concern. Recommend follow-up by clinical exam, with repeat Neck CT (IV contrast preferred) if the area further enlarges or becomes painful. Electronically Signed   By: Odessa Fleming M.D.   On: 04/11/2020 23:23    Assessment & Plan:   Jeffery was seen today for annual exam and depression.  Diagnoses and all orders for this visit:  Routine general medical examination at a health care  facility- Exam completed, no labs indicated, vaccines reviewed and updated, no cancer screenings indicated, patient education was given.  GAD (generalized anxiety disorder) -     escitalopram (LEXAPRO) 5 MG tablet; TAKE 1 TABLET(5 MG) BY MOUTH DAILY  Bradycardia- His heart rate is 60 and he is asymptomatic.  Other orders -     Flu Vaccine QUAD 6+ mos PF IM (Fluarix Quad PF)   I have discontinued Jeffery Holt's prednisoLONE acetate. I am also having him maintain his Humira Pen and escitalopram.  Meds ordered this encounter  Medications   escitalopram (LEXAPRO) 5 MG tablet    Sig: TAKE 1 TABLET(5 MG) BY MOUTH DAILY    Dispense:  90 tablet  Refill:  3     Follow-up: No follow-ups on file.  Scarlette Calico, MD

## 2022-05-23 ENCOUNTER — Other Ambulatory Visit (HOSPITAL_COMMUNITY): Payer: Self-pay

## 2022-05-23 NOTE — Telephone Encounter (Signed)
Received notification from [facility] regarding a prior authorization for [employer]. Authorization has been APPROVED from 05/18/22 to 05/17/22.   Unable to run test claim because [facility] has re-processed claim on 05/19/22.  Patient appears to be able to continue to fill through  [facility] # [id number]  Knox Saliva, PharmD, MPH, BCPS, CPP Clinical Pharmacist (Rheumatology and Pulmonology)

## 2022-06-15 ENCOUNTER — Other Ambulatory Visit: Payer: Self-pay | Admitting: *Deleted

## 2022-06-15 DIAGNOSIS — Z79899 Other long term (current) drug therapy: Secondary | ICD-10-CM

## 2022-06-16 ENCOUNTER — Encounter: Payer: Self-pay | Admitting: Rheumatology

## 2022-06-16 LAB — COMPLETE METABOLIC PANEL WITH GFR
AG Ratio: 2.5 (calc) (ref 1.0–2.5)
ALT: 18 U/L (ref 9–46)
AST: 21 U/L (ref 10–40)
Albumin: 4.8 g/dL (ref 3.6–5.1)
Alkaline phosphatase (APISO): 39 U/L (ref 36–130)
BUN: 16 mg/dL (ref 7–25)
CO2: 29 mmol/L (ref 20–32)
Calcium: 9.3 mg/dL (ref 8.6–10.3)
Chloride: 105 mmol/L (ref 98–110)
Creat: 1.14 mg/dL (ref 0.60–1.26)
Globulin: 1.9 g/dL (calc) (ref 1.9–3.7)
Glucose, Bld: 88 mg/dL (ref 65–99)
Potassium: 4.4 mmol/L (ref 3.5–5.3)
Sodium: 142 mmol/L (ref 135–146)
Total Bilirubin: 0.6 mg/dL (ref 0.2–1.2)
Total Protein: 6.7 g/dL (ref 6.1–8.1)
eGFR: 84 mL/min/{1.73_m2} (ref >=60)

## 2022-06-16 LAB — CBC WITH DIFFERENTIAL/PLATELET
Absolute Monocytes: 441 cells/uL (ref 200–950)
Basophils Absolute: 19 cells/uL (ref 0–200)
Basophils Relative: 0.3 %
Eosinophils Absolute: 170 cells/uL (ref 15–500)
Eosinophils Relative: 2.7 %
HCT: 41.8 % (ref 38.5–50.0)
Hemoglobin: 14.8 g/dL (ref 13.2–17.1)
Lymphs Abs: 2325 cells/uL (ref 850–3900)
MCH: 33.4 pg — ABNORMAL HIGH (ref 27.0–33.0)
MCHC: 35.4 g/dL (ref 32.0–36.0)
MCV: 94.4 fL (ref 80.0–100.0)
MPV: 9.7 fL (ref 7.5–12.5)
Monocytes Relative: 7 %
Neutro Abs: 3345 cells/uL (ref 1500–7800)
Neutrophils Relative %: 53.1 %
Platelets: 225 10*3/uL (ref 140–400)
RBC: 4.43 10*6/uL (ref 4.20–5.80)
RDW: 12.8 % (ref 11.0–15.0)
Total Lymphocyte: 36.9 %
WBC: 6.3 10*3/uL (ref 3.8–10.8)

## 2022-06-16 NOTE — Progress Notes (Signed)
CBC and CMP are normal.

## 2022-07-16 ENCOUNTER — Other Ambulatory Visit: Payer: Self-pay | Admitting: Physician Assistant

## 2022-07-16 DIAGNOSIS — H209 Unspecified iridocyclitis: Secondary | ICD-10-CM

## 2022-07-16 DIAGNOSIS — M47819 Spondylosis without myelopathy or radiculopathy, site unspecified: Secondary | ICD-10-CM

## 2022-07-16 NOTE — Telephone Encounter (Signed)
Next Visit: 10/11/2022  Last Visit: 05/10/2022  Last Fill:  03/27/2022  DX: Uveitis Spondyloarthropathy   Current Dose per office note 05/10/2022: Humira 40 mg sq injections every 14 days   Labs: 06/15/2022 CBC and CMP are normal.   TB Gold: 08/18/2021 Neg    Okay to refill Humira?

## 2022-10-01 NOTE — Progress Notes (Deleted)
Office Visit Note  Patient: Jeffery Holt             Date of Birth: Mar 29, 1986           MRN: [id number]             PCP: Janith Lima, MD Referring: Janith Lima, MD Visit Date: 10/11/2022 Occupation: @GUAROCC @  Subjective:  No chief complaint on file.   History of Present Illness: Jeffery Dripps is a 37 y.o. male ***     Activities of Daily Living:  Patient reports morning stiffness for *** {minute/hour:19697}.   Patient {ACTIONS;DENIES/REPORTS:21021675::"Denies"} nocturnal pain.  Difficulty dressing/grooming: {ACTIONS;DENIES/REPORTS:21021675::"Denies"} Difficulty climbing stairs: {ACTIONS;DENIES/REPORTS:21021675::"Denies"} Difficulty getting out of chair: {ACTIONS;DENIES/REPORTS:21021675::"Denies"} Difficulty using hands for taps, buttons, cutlery, and/or writing: {ACTIONS;DENIES/REPORTS:21021675::"Denies"}  No Rheumatology ROS completed.   PMFS History:  Patient Active Problem List   Diagnosis Date Noted   GAD (generalized anxiety disorder) 03/29/2021   Uveitis, intermediate, bilateral 03/29/2021   Bradycardia 03/29/2021   Routine general medical examination at a health care facility 11/17/2019   Patellar tendonitis of both knees 03/14/2017   Allergic rhinitis 07/12/2014   GERD (gastroesophageal reflux disease) 01/23/2013    Past Medical History:  Diagnosis Date   Allergy    GERD (gastroesophageal reflux disease)    Petit mal ([place])    as an infant    Uveitis    per patient, dx by Dr. Katy Fitch     Family History  Problem Relation Age of Onset   Rheum arthritis Mother    Parkinson's disease Mother    Arthritis/Rheumatoid Mother    Tremor Mother    Kidney Stones Father    Arthritis Maternal Grandmother    Rheum arthritis Paternal Grandmother    Alcohol abuse Neg Hx    Cancer Neg Hx    COPD Neg Hx    Depression Neg Hx    Diabetes Neg Hx    Drug abuse Neg Hx    Early death Neg Hx    Hearing loss Neg Hx    Heart disease Neg Hx    Hyperlipidemia Neg Hx     Hypertension Neg Hx    Kidney disease Neg Hx    Stroke Neg Hx    Colon cancer Neg Hx    Past Surgical History:  Procedure Laterality Date   COLONOSCOPY  06/12/2021   Tooth implant Left 10/14/2014   Social History   Social History Narrative   Not on file   Immunization History  Administered Date(s) Administered   Influenza Whole 06/22/2014   Influenza, Seasonal, Injecte, Preservative Fre 08/10/2013   Influenza,inj,Quad PF,6+ Mos 06/26/2016, 07/02/2017, 05/27/2018, 05/19/2019, 06/28/2020, 05/21/2022   Moderna Sars-Covid-2 Vaccination 10/30/2019, 11/27/2019   PFIZER(Purple Top)SARS-COV-2 Vaccination 07/29/2020   Tdap 02/11/2015     Objective: Vital Signs: There were no vitals taken for this visit.   Physical Exam   Musculoskeletal Exam: ***  CDAI Exam: CDAI Score: -- Patient Global: --; Provider Global: -- Swollen: --; Tender: -- Joint Exam 10/11/2022   No joint exam has been documented for this visit   There is currently no information documented on the homunculus. Go to the Rheumatology activity and complete the homunculus joint exam.  Investigation: No additional findings.  Imaging: No results found.  Recent Labs: Lab Results  Component Value Date   WBC 6.3 06/15/2022   HGB 14.8 06/15/2022   PLT 225 06/15/2022   NA 142 06/15/2022   K 4.4 06/15/2022   CL 105 06/15/2022   CO2 29  06/15/2022   GLUCOSE 88 06/15/2022   BUN 16 06/15/2022   CREATININE 1.14 06/15/2022   BILITOT 0.6 06/15/2022   ALKPHOS 54 11/17/2019   AST 21 06/15/2022   ALT 18 06/15/2022   PROT 6.7 06/15/2022   ALBUMIN 4.4 11/17/2019   CALCIUM 9.3 06/15/2022   GFRAA >89 09/22/2015   QFTBGOLDPLUS NEGATIVE 08/18/2021    Speciality Comments: Humira started July 13, 2021  Procedures:  No procedures performed Allergies: Patient has no known allergies.   Assessment / Plan:     Visit Diagnoses: No diagnosis found.  Orders: No orders of the defined types were placed in this  encounter.  No orders of the defined types were placed in this encounter.   Face-to-face time spent with patient was *** minutes. Greater than 50% of time was spent in counseling and coordination of care.  Follow-Up Instructions: No follow-ups on file.   Earnestine Mealing, CMA  Note - This record has been created using Editor, commissioning.  Chart creation errors have been sought, but may not always  have been located. Such creation errors do not reflect on  the standard of medical care.

## 2022-10-11 ENCOUNTER — Ambulatory Visit: Payer: Commercial Managed Care - HMO | Admitting: Physician Assistant

## 2022-10-11 DIAGNOSIS — R7989 Other specified abnormal findings of blood chemistry: Secondary | ICD-10-CM

## 2022-10-11 DIAGNOSIS — H209 Unspecified iridocyclitis: Secondary | ICD-10-CM

## 2022-10-11 DIAGNOSIS — E785 Hyperlipidemia, unspecified: Secondary | ICD-10-CM

## 2022-10-11 DIAGNOSIS — M47819 Spondylosis without myelopathy or radiculopathy, site unspecified: Secondary | ICD-10-CM

## 2022-10-11 DIAGNOSIS — Z79899 Other long term (current) drug therapy: Secondary | ICD-10-CM

## 2022-10-11 DIAGNOSIS — R590 Localized enlarged lymph nodes: Secondary | ICD-10-CM

## 2022-10-11 DIAGNOSIS — Z8719 Personal history of other diseases of the digestive system: Secondary | ICD-10-CM

## 2022-10-11 DIAGNOSIS — J3089 Other allergic rhinitis: Secondary | ICD-10-CM

## 2022-10-11 DIAGNOSIS — M545 Low back pain, unspecified: Secondary | ICD-10-CM

## 2022-10-11 DIAGNOSIS — M461 Sacroiliitis, not elsewhere classified: Secondary | ICD-10-CM

## 2022-10-11 DIAGNOSIS — M7651 Patellar tendinitis, right knee: Secondary | ICD-10-CM

## 2022-10-11 DIAGNOSIS — Z1589 Genetic susceptibility to other disease: Secondary | ICD-10-CM

## 2022-10-11 DIAGNOSIS — K529 Noninfective gastroenteritis and colitis, unspecified: Secondary | ICD-10-CM

## 2022-10-11 NOTE — Progress Notes (Unsigned)
Office Visit Note  Patient: Jeffery Holt             Date of Birth: 1986/07/07           MRN: [id number]             PCP: Janith Lima, MD Referring: Janith Lima, MD Visit Date: 10/22/2022 Occupation: @GUAROCC$ @  Subjective:  Medication monitoring   History of Present Illness: Jeffery Orrison is a 37 y.o. male with history of spondyloarthropathy.  Patient remains on Humira 40 mg subcutaneous injections every 14 days.  Patient continues to tolerate Humira without any side effects or injection site reactions.  He states that he is due for in his next Humira injection but will require refill.  He denies any recent uveitis flares.  He denies any signs of inflammatory back pain recently.  He remains very active strength training on a regular basis.  He denies any SI joint pain at this time.  He denies any groin pain.  He denies any evidence of Achilles tendinitis or plantar fasciitis.  He is not experiencing any joint swelling at this time.  He also has some increased pain in both hands especially in his PIP joints which he attributes to recently taking up rockclimbing.    Activities of Daily Living:  Patient reports morning stiffness for 20 minutes.   Patient Denies nocturnal pain.  Difficulty dressing/grooming: Denies Difficulty climbing stairs: Denies Difficulty getting out of chair: Denies Difficulty using hands for taps, buttons, cutlery, and/or writing: Denies  Review of Systems  Constitutional:  Negative for fatigue.  HENT:  Negative for mouth sores and mouth dryness.   Eyes:  Negative for dryness.  Respiratory:  Negative for shortness of breath.   Cardiovascular:  Negative for chest pain and palpitations.  Gastrointestinal:  Negative for blood in stool, constipation and diarrhea.  Endocrine: Negative for increased urination.  Genitourinary:  Negative for involuntary urination.  Musculoskeletal:  Positive for morning stiffness. Negative for joint pain, gait problem, joint pain,  joint swelling, myalgias, muscle weakness, muscle tenderness and myalgias.  Skin:  Negative for color change, hair loss and sensitivity to sunlight.  Allergic/Immunologic: Negative for susceptible to infections.  Neurological:  Negative for dizziness and headaches.  Hematological:  Negative for swollen glands.  Psychiatric/Behavioral:  Negative for depressed mood and sleep disturbance. The patient is nervous/anxious.     PMFS History:  Patient Active Problem List   Diagnosis Date Noted   GAD (generalized anxiety disorder) 03/29/2021   Uveitis, intermediate, bilateral 03/29/2021   Bradycardia 03/29/2021   Routine general medical examination at a health care facility 11/17/2019   Patellar tendonitis of both knees 03/14/2017   Allergic rhinitis 07/12/2014   GERD (gastroesophageal reflux disease) 01/23/2013    Past Medical History:  Diagnosis Date   Allergy    GERD (gastroesophageal reflux disease)    Petit mal ([place])    as an infant    Uveitis    per patient, dx by Dr. Katy Fitch     Family History  Problem Relation Age of Onset   Rheum arthritis Mother    Parkinson's disease Mother    Arthritis/Rheumatoid Mother    Tremor Mother    Kidney Stones Father    Arthritis Maternal Grandmother    Rheum arthritis Paternal Grandmother    Alcohol abuse Neg Hx    Cancer Neg Hx    COPD Neg Hx    Depression Neg Hx    Diabetes Neg Hx  Drug abuse Neg Hx    Early death Neg Hx    Hearing loss Neg Hx    Heart disease Neg Hx    Hyperlipidemia Neg Hx    Hypertension Neg Hx    Kidney disease Neg Hx    Stroke Neg Hx    Colon cancer Neg Hx    Past Surgical History:  Procedure Laterality Date   COLONOSCOPY  06/12/2021   Tooth implant Left 10/14/2014   Social History   Social History Narrative   Not on file   Immunization History  Administered Date(s) Administered   Influenza Whole 06/22/2014   Influenza, Seasonal, Injecte, Preservative Fre 08/10/2013   Influenza,inj,Quad PF,6+ Mos  06/26/2016, 07/02/2017, 05/27/2018, 05/19/2019, 06/28/2020, 05/21/2022   Moderna Sars-Covid-2 Vaccination 10/30/2019, 11/27/2019   PFIZER(Purple Top)SARS-COV-2 Vaccination 07/29/2020   Tdap 02/11/2015     Objective: Vital Signs: BP 120/82 (BP Location: Left Arm, Patient Position: Sitting, Cuff Size: Large)   Pulse (!) 59   Ht 6' 3"$  (1.905 m)   Wt 235 lb (106.6 kg)   BMI 29.37 kg/m    Physical Exam Vitals and nursing note reviewed.  Constitutional:      Appearance: He is well-developed.  HENT:     Head: Normocephalic and atraumatic.  Eyes:     Conjunctiva/sclera: Conjunctivae normal.     Pupils: Pupils are equal, round, and reactive to light.  Cardiovascular:     Rate and Rhythm: Normal rate and regular rhythm.     Heart sounds: Normal heart sounds.  Pulmonary:     Effort: Pulmonary effort is normal.     Breath sounds: Normal breath sounds.  Abdominal:     General: Bowel sounds are normal.     Palpations: Abdomen is soft.  Musculoskeletal:     Cervical back: Normal range of motion and neck supple.  Skin:    General: Skin is warm and dry.     Capillary Refill: Capillary refill takes less than 2 seconds.  Neurological:     Mental Status: He is alert and oriented to person, place, and time.  Psychiatric:        Behavior: Behavior normal.      Musculoskeletal Exam: C-spine, thoracic spine, lumbar spine have good range of motion.  Some midline spinal tenderness in the lumbar region.  No SI joint tenderness upon palpation.  Shoulder joints, elbow joints, wrist joints, MCPs, PIPs, DIPs have good range of motion with no synovitis.  Complete fist formation bilaterally.  Hip joints have good range of motion with no groin pain.  Knee joints have good range of motion with no warmth or effusion.  Ankle joints have good range of motion with no tenderness or joint swelling.  No evidence of Achilles tendinitis or plantar fasciitis.  CDAI Exam: CDAI Score: -- Patient Global: --;  Provider Global: -- Swollen: --; Tender: -- Joint Exam 10/22/2022   No joint exam has been documented for this visit   There is currently no information documented on the homunculus. Go to the Rheumatology activity and complete the homunculus joint exam.  Investigation: No additional findings.  Imaging: No results found.  Recent Labs: Lab Results  Component Value Date   WBC 6.3 06/15/2022   HGB 14.8 06/15/2022   PLT 225 06/15/2022   NA 142 06/15/2022   K 4.4 06/15/2022   CL 105 06/15/2022   CO2 29 06/15/2022   GLUCOSE 88 06/15/2022   BUN 16 06/15/2022   CREATININE 1.14 06/15/2022   BILITOT 0.6 06/15/2022  ALKPHOS 54 11/17/2019   AST 21 06/15/2022   ALT 18 06/15/2022   PROT 6.7 06/15/2022   ALBUMIN 4.4 11/17/2019   CALCIUM 9.3 06/15/2022   GFRAA >89 09/22/2015   QFTBGOLDPLUS NEGATIVE 08/18/2021    Speciality Comments: Humira started July 13, 2021  Procedures:  No procedures performed Allergies: Patient has no known allergies.   Assessment / Plan:     Visit Diagnoses: Spondyloarthropathy: He has not been experiencing any signs or symptoms of a flare.  He has clinically been doing well on Humira 40 mg sq injections every 14 days.  He has been tolerating Humira without any side effects or injection site reactions.  He has no synovitis or dactylitis on examination today.  No evidence of Achilles tendinitis or plantar fasciitis.  No SI joint tenderness upon palpation.  He has not been experiencing any nocturnal pain or increased morning stiffness.  He remains very active strength training on a daily basis for exercise.  He will remain on Humira as prescribed.  A refill was sent to the pharmacy today.  He plans on having updated lab work drawn this week.  Future order for CBC, CMP, TB Gold placed today.  He will follow-up in the office in 5 months or sooner if needed.  HLA B27 positive  Uveitis - HLA-B27 positive, history of recurrent uveitis.  Followed by Dr. Coralyn Pear.   He has not had any signs or symptoms of a uveitis flare.  He has not required prednisolone eyedrops recently.  He remains on Humira 40 mg subcutaneous injections every 14 days.  He was advised to notify us if he develops signs or symptoms of a flare.  High risk medication use - Humira 40 mg sq injections every 14 days-initiated on 07/13/21 CBC and CMP were updated on 06/15/2022.  Orders for CBC and CMP were released today.  His next lab work will be due in May and every 3 months to monitor for drug toxicity. TB Gold negative on 08/18/2021.  Order for TB gold released today. Discussed the importance of holding Humira if he develops signs or symptoms of an infection and to resume once the infection has completely cleared.  - Plan: CBC with Differential/Platelet, COMPLETE METABOLIC PANEL WITH GFR, QuantiFERON-TB Gold Plus  Screening for tuberculosis -Future order for TB Gold placed today.  Plan: QuantiFERON-TB Gold Plus  Elevated serum creatinine: Creatinine was 1.14 and GFR was 84 on 06/15/2022.  Patient plans on having updated lab work drawn tomorrow.  Future order for CMP with GFR placed today.  Patellar tendonitis of both knees: Patient has good range of motion of both knee joints on examination today.  No warmth or effusion noted.  He has been working on lower extremity muscle strengthening.   Sacroiliitis [facility]): He has no SI joint tenderness upon palpation today.  Chronic midline low back pain without sciatica: He is experiencing some midline spinal tenderness in the lumbar region currently.  He attributes his increased discomfort to working with [employer] this past weekend for strength training.  He is not experiencing symptoms of radiculopathy.  Other medical conditions are listed as follows:  Ileitis: No recent flares.  History of gastroesophageal reflux (GERD)  Dyslipidemia  Seasonal allergic rhinitis due to other allergic trigger  Cervical lymphadenopathy    Orders: Orders  Placed This Encounter  Procedures   CBC with Differential/Platelet   COMPLETE METABOLIC PANEL WITH GFR   QuantiFERON-TB Gold Plus   Meds ordered this encounter  Medications   Adalimumab (  HUMIRA, 2 PEN,) 40 MG/0.4ML PNKT    Sig: INJECT 1 PEN SUBCUTANEOUSLY ONCE EVERY 14 DAYS.    Dispense:  6 each    Refill:  0    Follow-Up Instructions: Return in about 5 months (around 03/22/2023) for Spondyloarthropathy.   Ofilia Neas, PA-C  Note - This record has been created using Dragon software.  Chart creation errors have been sought, but may not always  have been located. Such creation errors do not reflect on  the standard of medical care.

## 2022-10-22 ENCOUNTER — Ambulatory Visit: Payer: Commercial Managed Care - HMO | Attending: Physician Assistant | Admitting: Physician Assistant

## 2022-10-22 ENCOUNTER — Encounter: Payer: Self-pay | Admitting: Physician Assistant

## 2022-10-22 VITALS — BP 120/82 | HR 59 | Ht 75.0 in | Wt 235.0 lb

## 2022-10-22 DIAGNOSIS — H209 Unspecified iridocyclitis: Secondary | ICD-10-CM

## 2022-10-22 DIAGNOSIS — J3089 Other allergic rhinitis: Secondary | ICD-10-CM

## 2022-10-22 DIAGNOSIS — E785 Hyperlipidemia, unspecified: Secondary | ICD-10-CM

## 2022-10-22 DIAGNOSIS — Z111 Encounter for screening for respiratory tuberculosis: Secondary | ICD-10-CM

## 2022-10-22 DIAGNOSIS — Z8719 Personal history of other diseases of the digestive system: Secondary | ICD-10-CM

## 2022-10-22 DIAGNOSIS — M47819 Spondylosis without myelopathy or radiculopathy, site unspecified: Secondary | ICD-10-CM

## 2022-10-22 DIAGNOSIS — Z1589 Genetic susceptibility to other disease: Secondary | ICD-10-CM | POA: Diagnosis not present

## 2022-10-22 DIAGNOSIS — K529 Noninfective gastroenteritis and colitis, unspecified: Secondary | ICD-10-CM

## 2022-10-22 DIAGNOSIS — R7989 Other specified abnormal findings of blood chemistry: Secondary | ICD-10-CM

## 2022-10-22 DIAGNOSIS — M461 Sacroiliitis, not elsewhere classified: Secondary | ICD-10-CM

## 2022-10-22 DIAGNOSIS — Z79899 Other long term (current) drug therapy: Secondary | ICD-10-CM | POA: Diagnosis not present

## 2022-10-22 DIAGNOSIS — R590 Localized enlarged lymph nodes: Secondary | ICD-10-CM

## 2022-10-22 DIAGNOSIS — M7652 Patellar tendinitis, left knee: Secondary | ICD-10-CM

## 2022-10-22 DIAGNOSIS — M7651 Patellar tendinitis, right knee: Secondary | ICD-10-CM

## 2022-10-22 DIAGNOSIS — M545 Low back pain, unspecified: Secondary | ICD-10-CM

## 2022-10-22 DIAGNOSIS — G8929 Other chronic pain: Secondary | ICD-10-CM

## 2022-10-22 MED ORDER — HUMIRA (2 PEN) 40 MG/0.4ML ~~LOC~~ AJKT: AUTO-INJECTOR | SUBCUTANEOUS | 0 refills | Status: DC

## 2022-10-22 NOTE — Patient Instructions (Signed)
Standing Labs We placed an order today for your standing lab work.   Please have your standing labs drawn in February and every 3 months   Please have your labs drawn 2 weeks prior to your appointment so that the provider can discuss your lab results at your appointment.  Please note that you may see your imaging and lab results in [place] before we have reviewed them. We will contact you once all results are reviewed. Please allow our office up to 72 hours to thoroughly review all of the results before contacting the office for clarification of your results.  Lab hours are:   Monday through Thursday from 8:00 am -12:30 pm and 1:00 pm-5:00 pm and Friday from 8:00 am-12:00 pm.  Please be advised, all patients with office appointments requiring lab work will take precedent over walk-in lab work.   Labs are drawn by [facility]. Please bring your co-pay at the time of your lab draw.  You may receive a bill from [facility] for your lab work.  Please note if you are on Hydroxychloroquine and and an order has been placed for a Hydroxychloroquine level, you will need to have it drawn 4 hours or more after your last dose.  If you wish to have your labs drawn at another location, please call the office 24 hours in advance so we can fax the orders.  The office is located at [address], [place], [place], [place] [address] No appointment is necessary.    If you have any questions regarding directions or hours of operation,  please call [phone].   As a reminder, please drink plenty of water prior to coming for your lab work. Thanks!

## 2022-10-23 ENCOUNTER — Encounter: Payer: Self-pay | Admitting: Rheumatology

## 2022-10-23 ENCOUNTER — Telehealth: Payer: Self-pay | Admitting: *Deleted

## 2022-10-23 NOTE — Telephone Encounter (Signed)
Patient has been advised to call Humira Complete copay card company for assistance on this. MyChart message was sent back to pt  Knox Saliva, PharmD, MPH, BCPS, CPP Clinical Pharmacist (Rheumatology and Pulmonology)

## 2022-10-23 NOTE — Telephone Encounter (Signed)
Patient contacted the office stating that he received a call from [facility] and they advised him that his Humira was going to cost him $7,000. Patient states they advised patient his co-pay card balance had been used. Please each out to patient and advise.

## 2022-10-30 ENCOUNTER — Other Ambulatory Visit: Payer: Self-pay | Admitting: *Deleted

## 2022-10-30 DIAGNOSIS — Z79899 Other long term (current) drug therapy: Secondary | ICD-10-CM

## 2022-10-30 DIAGNOSIS — Z111 Encounter for screening for respiratory tuberculosis: Secondary | ICD-10-CM

## 2022-10-31 NOTE — Progress Notes (Signed)
CBC and CMP WNL

## 2022-11-01 LAB — CBC WITH DIFFERENTIAL/PLATELET
Absolute Monocytes: 518 cells/uL (ref 200–950)
Basophils Absolute: 21 cells/uL (ref 0–200)
Basophils Relative: 0.3 %
Eosinophils Absolute: 242 cells/uL (ref 15–500)
Eosinophils Relative: 3.5 %
HCT: 42.3 % (ref 38.5–50.0)
Hemoglobin: 14.7 g/dL (ref 13.2–17.1)
Lymphs Abs: 2532 cells/uL (ref 850–3900)
MCH: 32.7 pg (ref 27.0–33.0)
MCHC: 34.8 g/dL (ref 32.0–36.0)
MCV: 94 fL (ref 80.0–100.0)
MPV: 9.7 fL (ref 7.5–12.5)
Monocytes Relative: 7.5 %
Neutro Abs: 3588 cells/uL (ref 1500–7800)
Neutrophils Relative %: 52 %
Platelets: 249 10*3/uL (ref 140–400)
RBC: 4.5 10*6/uL (ref 4.20–5.80)
RDW: 13 % (ref 11.0–15.0)
Total Lymphocyte: 36.7 %
WBC: 6.9 10*3/uL (ref 3.8–10.8)

## 2022-11-01 LAB — COMPLETE METABOLIC PANEL WITH GFR
AG Ratio: 2.3 (calc) (ref 1.0–2.5)
ALT: 21 U/L (ref 9–46)
AST: 23 U/L (ref 10–40)
Albumin: 4.6 g/dL (ref 3.6–5.1)
Alkaline phosphatase (APISO): 43 U/L (ref 36–130)
BUN: 16 mg/dL (ref 7–25)
CO2: 29 mmol/L (ref 20–32)
Calcium: 9.1 mg/dL (ref 8.6–10.3)
Chloride: 105 mmol/L (ref 98–110)
Creat: 1.22 mg/dL (ref 0.60–1.26)
Globulin: 2 g/dL (calc) (ref 1.9–3.7)
Glucose, Bld: 92 mg/dL (ref 65–99)
Potassium: 4.2 mmol/L (ref 3.5–5.3)
Sodium: 142 mmol/L (ref 135–146)
Total Bilirubin: 0.5 mg/dL (ref 0.2–1.2)
Total Protein: 6.6 g/dL (ref 6.1–8.1)
eGFR: 78 mL/min/{1.73_m2} (ref >=60)

## 2022-11-01 LAB — QUANTIFERON-TB GOLD PLUS
Mitogen-NIL: >10 IU/mL
NIL: 0.04 IU/mL
QuantiFERON-TB Gold Plus: NEGATIVE
TB1-NIL: <0 IU/mL
TB2-NIL: <0 IU/mL

## 2022-11-01 NOTE — Progress Notes (Signed)
TB Gold is negative.

## 2023-01-23 ENCOUNTER — Encounter: Payer: Self-pay | Admitting: *Deleted

## 2023-01-23 ENCOUNTER — Other Ambulatory Visit: Payer: Self-pay | Admitting: Physician Assistant

## 2023-01-23 DIAGNOSIS — H209 Unspecified iridocyclitis: Secondary | ICD-10-CM

## 2023-01-23 DIAGNOSIS — M47819 Spondylosis without myelopathy or radiculopathy, site unspecified: Secondary | ICD-10-CM

## 2023-01-23 NOTE — Telephone Encounter (Signed)
Last Fill: 10/22/2022  Labs: 10/30/2022 CBC and CMP WNL   TB Gold: 10/30/2022 Neg    Next Visit: 03/27/2023  Last Visit: 10/22/2022  DX: Spondyloarthropathy   Current Dose per office note 10/22/2022: Humira 40 mg sq injections every 14 days   Message sent to patient to advised he is due to update labs.   Okay to refill Humira?

## 2023-01-30 ENCOUNTER — Other Ambulatory Visit: Payer: Self-pay | Admitting: *Deleted

## 2023-01-30 DIAGNOSIS — R7989 Other specified abnormal findings of blood chemistry: Secondary | ICD-10-CM

## 2023-01-30 DIAGNOSIS — Z79899 Other long term (current) drug therapy: Secondary | ICD-10-CM

## 2023-01-30 DIAGNOSIS — H209 Unspecified iridocyclitis: Secondary | ICD-10-CM

## 2023-01-30 DIAGNOSIS — K529 Noninfective gastroenteritis and colitis, unspecified: Secondary | ICD-10-CM

## 2023-01-30 DIAGNOSIS — Z1589 Genetic susceptibility to other disease: Secondary | ICD-10-CM

## 2023-01-31 LAB — COMPLETE METABOLIC PANEL WITH GFR
AG Ratio: 2.4 (calc) (ref 1.0–2.5)
ALT: 19 U/L (ref 9–46)
AST: 21 U/L (ref 10–40)
Albumin: 4.8 g/dL (ref 3.6–5.1)
Alkaline phosphatase (APISO): 47 U/L (ref 36–130)
BUN: 14 mg/dL (ref 7–25)
CO2: 29 mmol/L (ref 20–32)
Calcium: 9.5 mg/dL (ref 8.6–10.3)
Chloride: 103 mmol/L (ref 98–110)
Creat: 1.14 mg/dL (ref 0.60–1.26)
Globulin: 2 g/dL (calc) (ref 1.9–3.7)
Glucose, Bld: 91 mg/dL (ref 65–99)
Potassium: 4.3 mmol/L (ref 3.5–5.3)
Sodium: 140 mmol/L (ref 135–146)
Total Bilirubin: 0.5 mg/dL (ref 0.2–1.2)
Total Protein: 6.8 g/dL (ref 6.1–8.1)
eGFR: 84 mL/min/{1.73_m2} (ref >=60)

## 2023-01-31 LAB — CBC WITH DIFFERENTIAL/PLATELET
Absolute Monocytes: 496 cells/uL (ref 200–950)
Basophils Absolute: 22 cells/uL (ref 0–200)
Basophils Relative: 0.3 %
Eosinophils Absolute: 259 cells/uL (ref 15–500)
Eosinophils Relative: 3.5 %
HCT: 43.7 % (ref 38.5–50.0)
Hemoglobin: 15.1 g/dL (ref 13.2–17.1)
Lymphs Abs: 2457 cells/uL (ref 850–3900)
MCH: 32.7 pg (ref 27.0–33.0)
MCHC: 34.6 g/dL (ref 32.0–36.0)
MCV: 94.6 fL (ref 80.0–100.0)
MPV: 9.6 fL (ref 7.5–12.5)
Monocytes Relative: 6.7 %
Neutro Abs: 4166 cells/uL (ref 1500–7800)
Neutrophils Relative %: 56.3 %
Platelets: 246 10*3/uL (ref 140–400)
RBC: 4.62 10*6/uL (ref 4.20–5.80)
RDW: 12.8 % (ref 11.0–15.0)
Total Lymphocyte: 33.2 %
WBC: 7.4 10*3/uL (ref 3.8–10.8)

## 2023-01-31 NOTE — Progress Notes (Signed)
CBC and CMP are normal.

## 2023-02-20 ENCOUNTER — Other Ambulatory Visit: Payer: Self-pay | Admitting: Physician Assistant

## 2023-02-20 DIAGNOSIS — M47819 Spondylosis without myelopathy or radiculopathy, site unspecified: Secondary | ICD-10-CM

## 2023-02-20 DIAGNOSIS — H209 Unspecified iridocyclitis: Secondary | ICD-10-CM

## 2023-02-20 NOTE — Telephone Encounter (Signed)
Last Fill: 01/23/2023 (30 day supply)  Labs: 01/30/2023 CBC and CMP are normal.   TB Gold: 10/30/2022 Neg    Next Visit: 03/27/2023  Last Visit: 10/22/2022  [id number]   Current Dose per office note 10/22/2022: Humira 40 mg sq injections every 14 days   Okay to refill Humira?

## 2023-03-13 NOTE — Progress Notes (Signed)
Office Visit Note  Patient: Jeffery Holt             Date of Birth: 12/15/1985           MRN: [id number]             PCP: [name], MD Referring: [name], MD Visit Date: 03/27/2023 Occupation: @GUAROCC @  Subjective:  Right knee joint pain   History of Present Illness: Jeffery Homes is a 37 y.o. male with history of spondyloarthropathy and uveitis.  Patient remains on Humira 40 mg sq injections every 14 days-initiated on 07/13/21.  Patient is tolerating Humira without any side effects.  Patient states with his last dose of Humira he had a mishap with the device and some of the medication leaked so he did not receive the full dose.  Patient states that he has noticed some increased arthralgias and joint stiffness.  He states for the past few weeks he has had increased discomfort in his right knee joint.  He denies any recent injury or fall.  He states the pain is most severe while jogging or pushing off going up steps.  He has been trying to perform stretching exercises as well as has been crosstraining.  He states he has also purchased new shoes but it is too soon to tell if they have made a huge difference.  He denies any Achilles tendinitis or plantar fasciitis.  He experiences occasional inflammation in his right eye but has not followed up with Dr. Vanessa Barbara recently.  During these episodes he typically experiences some watering of the eye and the eye will become more pink.  He has not had any eye pain or photophobia recently.  He experiences intermittent stiffness in his neck and lower back. He denies any recent or recurrent infections.    Activities of Daily Living:  Patient reports morning stiffness for 30 minutes.   Patient Denies nocturnal pain.  Difficulty dressing/grooming: Denies Difficulty climbing stairs: Denies Difficulty getting out of chair: Denies Difficulty using hands for taps, buttons, cutlery, and/or writing: Denies  Review of Systems  Constitutional:  Negative  for fatigue.  HENT:  Negative for mouth sores and mouth dryness.   Eyes:  Positive for redness. Negative for dryness.  Respiratory:  Negative for shortness of breath.   Cardiovascular:  Negative for chest pain and palpitations.  Gastrointestinal:  Negative for blood in stool, constipation and diarrhea.  Endocrine: Negative for increased urination.  Genitourinary:  Negative for involuntary urination.  Musculoskeletal:  Positive for joint pain, joint pain and morning stiffness. Negative for gait problem, joint swelling, myalgias, muscle weakness, muscle tenderness and myalgias.  Skin:  Negative for color change, rash, hair loss and sensitivity to sunlight.  Allergic/Immunologic: Negative for susceptible to infections.  Neurological:  Negative for dizziness and headaches.  Hematological:  Positive for swollen glands.  Psychiatric/Behavioral:  Negative for depressed mood and sleep disturbance. The patient is nervous/anxious.     PMFS History:  Patient Active Problem List   Diagnosis Date Noted   GAD (generalized anxiety disorder) 03/29/2021   Uveitis, intermediate, bilateral 03/29/2021   Bradycardia 03/29/2021   Routine general medical examination at a health care facility 11/17/2019   Patellar tendonitis of both knees 03/14/2017   Allergic rhinitis 07/12/2014   GERD (gastroesophageal reflux disease) 01/23/2013    Past Medical History:  Diagnosis Date   Allergy    GERD (gastroesophageal reflux disease)    Petit mal (HCC)    as an infant  Uveitis    per patient, dx by Dr. Dione Booze     Family History  Problem Relation Age of Onset   Rheum arthritis Mother    Parkinson's disease Mother    Arthritis/Rheumatoid Mother    Tremor Mother    Kidney Stones Father    Arthritis Maternal Grandmother    Rheum arthritis Paternal Grandmother    Alcohol abuse Neg Hx    Cancer Neg Hx    COPD Neg Hx    Depression Neg Hx    Diabetes Neg Hx    Drug abuse Neg Hx    Early death Neg Hx     Hearing loss Neg Hx    Heart disease Neg Hx    Hyperlipidemia Neg Hx    Hypertension Neg Hx    Kidney disease Neg Hx    Stroke Neg Hx    Colon cancer Neg Hx    Past Surgical History:  Procedure Laterality Date   COLONOSCOPY  06/12/2021   Tooth implant Left 10/14/2014   Social History   Social History Narrative   Not on file   Immunization History  Administered Date(s) Administered   Influenza Whole 06/22/2014   Influenza, Seasonal, Injecte, Preservative Fre 08/10/2013   Influenza,inj,Quad PF,6+ Mos 06/26/2016, 07/02/2017, 05/27/2018, 05/19/2019, 06/28/2020, 05/21/2022   Moderna Sars-Covid-2 Vaccination 10/30/2019, 11/27/2019   PFIZER(Purple Top)SARS-COV-2 Vaccination 07/29/2020   Tdap 02/11/2015     Objective: Vital Signs: BP (!) 132/90 (BP Location: Left Arm, Patient Position: Sitting, Cuff Size: Large)   Pulse (!) 59   Resp 18   Ht 6\' 3"  (1.905 m)   Wt 236 lb (107 kg)   BMI 29.50 kg/m    Physical Exam Vitals and nursing note reviewed.  Constitutional:      Appearance: He is well-developed.  HENT:     Head: Normocephalic and atraumatic.  Eyes:     Conjunctiva/sclera: Conjunctivae normal.     Pupils: Pupils are equal, round, and reactive to light.  Cardiovascular:     Rate and Rhythm: Normal rate and regular rhythm.     Heart sounds: Normal heart sounds.  Pulmonary:     Effort: Pulmonary effort is normal.     Breath sounds: Normal breath sounds.  Abdominal:     General: Bowel sounds are normal.     Palpations: Abdomen is soft.  Musculoskeletal:     Cervical back: Normal range of motion and neck supple.  Skin:    General: Skin is warm and dry.     Capillary Refill: Capillary refill takes less than 2 seconds.  Neurological:     Mental Status: He is alert and oriented to person, place, and time.  Psychiatric:        Behavior: Behavior normal.      Musculoskeletal Exam: C-spine has good ROM with some stiffness with lateral rotation.  Shoulder joints,  elbow joints, wrist joints, MCPs, PIPs, and DIPs good ROM with no synovitis. Complete fist formation bilaterally.  Hip joints have good ROM with some stiffness.  Knee joints have good ROM with no warmth or effusion.  Discomfort with ROM of the right knee.  Ankle joints have good ROM with no tenderness or joint swelling.  No evidence of achilles tendonitis.     CDAI Exam: CDAI Score: -- Patient Global: --; Provider Global: -- Swollen: --; Tender: -- Joint Exam 03/27/2023   No joint exam has been documented for this visit   There is currently no information documented on the homunculus. Go to  the Rheumatology activity and complete the homunculus joint exam.  Investigation: No additional findings.  Imaging: No results found.  Recent Labs: Lab Results  Component Value Date   WBC 7.4 01/30/2023   HGB 15.1 01/30/2023   PLT 246 01/30/2023   NA 140 01/30/2023   K 4.3 01/30/2023   CL 103 01/30/2023   CO2 29 01/30/2023   GLUCOSE 91 01/30/2023   BUN 14 01/30/2023   CREATININE 1.14 01/30/2023   BILITOT 0.5 01/30/2023   ALKPHOS 54 11/17/2019   AST 21 01/30/2023   ALT 19 01/30/2023   PROT 6.8 01/30/2023   ALBUMIN 4.4 11/17/2019   CALCIUM 9.5 01/30/2023   GFRAA >89 09/22/2015   QFTBGOLDPLUS NEGATIVE 10/30/2022    Speciality Comments: Humira started July 13, 2021  Procedures:  No procedures performed Allergies: Patient has no known allergies.    Assessment / Plan:     Visit Diagnoses: Spondyloarthropathy -Patient continues to experience intermittent arthralgias and joint stiffness.  He has stiffness in the C-spine and in his lumbar spine.  No SI joint pain.  He has been experiencing increased discomfort in his right knee joint for the last few weeks but has no warmth or effusion on examination today.  Most of his discomfort is under his kneecap and is consistent with patellofemoral arthritis.  Reviewed x-rays of both knees from 03/14/2017 which were consistent with minimal  patellofemoral degenerative change.  Patient declined to have updated x-rays today.  Different treatment options were discussed.  Patient remains very active exercising on a daily basis.  He is also started rock climbing and has been cross-training.  he was advised to notify us if his symptoms persist or worsen.  He has no evidence of patella tendinitis, Achilles tendinitis, or plantar fasciitis at this time.  He has not had signs of uveitis recently but he notices intermittent watering of his right eye as well as mild redness in both eyes intermittently.  He has been under the care of Dr. Vanessa Barbara but has not followed up recently.  He was advised to notify us if he has signs or symptoms of recurrent uveitis.  He remains on Humira 40 mg subcutaneous injections every 14 days.  His last dose of Humira had a mishap and he did not receive the full dose of the injection which she feels may be contributing to some of his increased symptoms recently.  Plan to check sed rate and CRP with his next lab work.  He will remain on Humira as prescribed for now.  Peers advised to notify us if he develops signs or symptoms of a flare.  He will follow-up in the office in 5 months or sooner if needed.  Plan: C-reactive protein, Sedimentation rate  Uveitis - HLA-B27 positive, history of recurrent uveitis.  Followed by Dr. Vanessa Barbara.  Advised patient to schedule a follow-up visit if he develops signs or symptoms of a flare.  He will remain on Humira as prescribed.  HLA B27 positive  High risk medication use - Humira 40 mg sq injections every 14 days-initiated on 07/13/21 TB gold negative on 10/30/22.  CBC and CMP updated on 01/30/23. His next lab work will be due in August and every 3 months.  Standing orders for CBC and CMP remain in place. No recent or recurrent infections. Discussed the importance of holding humira if he develops signs or symptoms of an infection and to resume once the infection has completely cleared.    Ileitis: Not currently symptomatic.  Patellar tendonitis of both knees: No tendonitis currently.  X-rays of both knees were performed on 03/14/2017 which were consistent with mild patellofemoral degenerative change.  Is been experiencing increased discomfort in the right knee especially under his kneecap with jogging as well as when climbing steps.  On examination today he has good range of motion of both knee joints with no warmth or effusion.  Offered to update x-rays of both knees today.  Different treatment options were discussed.  Patient was encouraged to perform quad strengthening exercises.  Discussed the use of Voltaren gel.  Discussed the possibility of viscosupplementation in the future.  He will notify us if his symptoms persist or worsen.  Sacroiliitis (HCC): No SI joint pain currently.   Elevated serum creatinine: Creatinine 1.14 and GFR 84 on 01/10/2023.  Family history of lymphoma - Plan to check CBC with diff, ESR, CRP, and SPEP with  upcoming lab work.  Future orders placed. Plan: Serum protein electrophoresis with reflex  Lymphadenopathy - Patient notices intermittent swelling in the lymph node on the left side of his neck.  Patient had ultrasound of the soft tissue of his neck on 10/13/2018 which revealed mildly enlarged bilateral upper cervical lymph nodes.  CT updated on 04/11/20: neck soft tissues and cervical lymph nodes are WNL.    His lymph node typically swells if he is experiencing increased inflammation or having signs of a flare.  According to the patient he has family history of lymphoma which has concerned him with the intermittent swelling of his cervical lymph node.  Plan to check sed rate, CRP, SPEP with his upcoming lab work.  Future orders placed today.  Plan: Serum protein electrophoresis with reflex, C-reactive protein, Sedimentation rate  Other medical conditions are listed as follows:   History of gastroesophageal reflux (GERD)  Dyslipidemia  Seasonal  allergic rhinitis due to other allergic trigger    Orders: Orders Placed This Encounter  Procedures   Serum protein electrophoresis with reflex   C-reactive protein   Sedimentation rate   No orders of the defined types were placed in this encounter.    Follow-Up Instructions: Return in about 5 months (around 08/27/2023) for Spondyloarthropathy.   Gearldine Bienenstock, PA-C  Note - This record has been created using Dragon software.  Chart creation errors have been sought, but may not always  have been located. Such creation errors do not reflect on  the standard of medical care.

## 2023-03-27 ENCOUNTER — Ambulatory Visit: Payer: Commercial Managed Care - HMO | Attending: Rheumatology | Admitting: Physician Assistant

## 2023-03-27 ENCOUNTER — Encounter: Payer: Self-pay | Admitting: Physician Assistant

## 2023-03-27 VITALS — BP 132/90 | HR 59 | Resp 18 | Ht 75.0 in | Wt 236.0 lb

## 2023-03-27 DIAGNOSIS — M47819 Spondylosis without myelopathy or radiculopathy, site unspecified: Secondary | ICD-10-CM

## 2023-03-27 DIAGNOSIS — M7652 Patellar tendinitis, left knee: Secondary | ICD-10-CM

## 2023-03-27 DIAGNOSIS — J3089 Other allergic rhinitis: Secondary | ICD-10-CM

## 2023-03-27 DIAGNOSIS — Z807 Family history of other malignant neoplasms of lymphoid, hematopoietic and related tissues: Secondary | ICD-10-CM

## 2023-03-27 DIAGNOSIS — M7651 Patellar tendinitis, right knee: Secondary | ICD-10-CM

## 2023-03-27 DIAGNOSIS — Z8719 Personal history of other diseases of the digestive system: Secondary | ICD-10-CM

## 2023-03-27 DIAGNOSIS — Z1589 Genetic susceptibility to other disease: Secondary | ICD-10-CM | POA: Diagnosis not present

## 2023-03-27 DIAGNOSIS — E785 Hyperlipidemia, unspecified: Secondary | ICD-10-CM

## 2023-03-27 DIAGNOSIS — K529 Noninfective gastroenteritis and colitis, unspecified: Secondary | ICD-10-CM

## 2023-03-27 DIAGNOSIS — Z79899 Other long term (current) drug therapy: Secondary | ICD-10-CM | POA: Diagnosis not present

## 2023-03-27 DIAGNOSIS — H209 Unspecified iridocyclitis: Secondary | ICD-10-CM

## 2023-03-27 DIAGNOSIS — M461 Sacroiliitis, not elsewhere classified: Secondary | ICD-10-CM

## 2023-03-27 DIAGNOSIS — R591 Generalized enlarged lymph nodes: Secondary | ICD-10-CM

## 2023-03-27 DIAGNOSIS — R7989 Other specified abnormal findings of blood chemistry: Secondary | ICD-10-CM

## 2023-03-27 NOTE — Patient Instructions (Signed)
Standing Labs We placed an order today for your standing lab work.   Please have your standing labs drawn in August and every 3 months   Please have your labs drawn 2 weeks prior to your appointment so that the provider can discuss your lab results at your appointment, if possible.  Please note that you may see your imaging and lab results in MyChart before we have reviewed them. We will contact you once all results are reviewed. Please allow our office up to 72 hours to thoroughly review all of the results before contacting the office for clarification of your results.  WALK-IN LAB HOURS  Monday through Thursday from 8:00 am -12:30 pm and 1:00 pm-5:00 pm and Friday from 8:00 am-12:00 pm.  Patients with office visits requiring labs will be seen before walk-in labs.  You may encounter longer than normal wait times. Please allow additional time. Wait times may be shorter on  Monday and Thursday afternoons.  We do not book appointments for walk-in labs. We appreciate your patience and understanding with our staff.   Labs are drawn by [facility]. Please bring your co-pay at the time of your lab draw.  You may receive a bill from [facility] for your lab work.  Please note if you are on Hydroxychloroquine and and an order has been placed for a Hydroxychloroquine level,  you will need to have it drawn 4 hours or more after your last dose.  If you wish to have your labs drawn at another location, please call the office 24 hours in advance so we can fax the orders.  The office is located at [address], [address], [place], [place] [address]   If you have any questions regarding directions or hours of operation,  please call [phone].   As a reminder, please drink plenty of water prior to coming for your lab work. Thanks!  

## 2023-05-10 ENCOUNTER — Other Ambulatory Visit (HOSPITAL_COMMUNITY): Payer: Self-pay

## 2023-05-10 ENCOUNTER — Other Ambulatory Visit: Payer: Self-pay | Admitting: Physician Assistant

## 2023-05-10 ENCOUNTER — Telehealth: Payer: Self-pay | Admitting: Pharmacist

## 2023-05-10 DIAGNOSIS — H209 Unspecified iridocyclitis: Secondary | ICD-10-CM

## 2023-05-10 DIAGNOSIS — M47819 Spondylosis without myelopathy or radiculopathy, site unspecified: Secondary | ICD-10-CM

## 2023-05-10 NOTE — Telephone Encounter (Signed)
Last Fill: 02/20/2023  Labs: 01/30/2023 CBC and CMP are normal.   TB Gold: 10/30/2022 negative    Next Visit: 08/20/2023  Last Visit: 03/27/2023  [id number]   Current Dose per office note on 03/27/2023: Humira 40 mg sq injections every 14 days   Advised patient he is due to update labs. Patient states he will update on 05/13/2023. Lab orders are in place.   Okay to refill Humira?

## 2023-05-10 NOTE — Telephone Encounter (Signed)
Attempted to submit a Prior Authorization RENEWAL request to [employer] for HUMIRA via CoverMyMeds.  SAVED PA. Patient will call back to confirm any new insurance information. He states he made some changes recently but no ID number change from his understanding. He will call insurance to make sure his plan is active  Key: [id number]   Called [facility] to determine fill history. Last fill was 02/21/2023 (for 84 day supply). They used same ID number that we had on file last year  Chesley Mires, PharmD, MPH, BCPS, CPP Clinical Pharmacist (Rheumatology and Pulmonology)

## 2023-05-14 ENCOUNTER — Other Ambulatory Visit: Payer: Self-pay | Admitting: *Deleted

## 2023-05-14 DIAGNOSIS — Z807 Family history of other malignant neoplasms of lymphoid, hematopoietic and related tissues: Secondary | ICD-10-CM

## 2023-05-14 DIAGNOSIS — K529 Noninfective gastroenteritis and colitis, unspecified: Secondary | ICD-10-CM

## 2023-05-14 DIAGNOSIS — M47819 Spondylosis without myelopathy or radiculopathy, site unspecified: Secondary | ICD-10-CM

## 2023-05-14 DIAGNOSIS — Z1589 Genetic susceptibility to other disease: Secondary | ICD-10-CM

## 2023-05-14 DIAGNOSIS — R591 Generalized enlarged lymph nodes: Secondary | ICD-10-CM

## 2023-05-14 DIAGNOSIS — H209 Unspecified iridocyclitis: Secondary | ICD-10-CM

## 2023-05-14 DIAGNOSIS — Z79899 Other long term (current) drug therapy: Secondary | ICD-10-CM

## 2023-05-14 DIAGNOSIS — R7989 Other specified abnormal findings of blood chemistry: Secondary | ICD-10-CM

## 2023-05-15 NOTE — Progress Notes (Signed)
ESR WNL

## 2023-05-15 NOTE — Progress Notes (Signed)
CBC and CMP normal

## 2023-05-15 NOTE — Progress Notes (Signed)
CRP WNL

## 2023-05-20 NOTE — Telephone Encounter (Signed)
PA now processing successfully for Humira renewal  Key: [id number]   Chesley Mires, PharmD, MPH, BCPS, CPP Clinical Pharmacist (Rheumatology and Pulmonology)

## 2023-05-21 NOTE — Telephone Encounter (Signed)
Received notification from [employer] regarding a prior authorization for HUMIRA. Authorization has been APPROVED from 05/20/23 to 05/19/24.  Patient can continue to fill through [facility]: [phone]  Authorization # [id number]  Chesley Mires, PharmD, MPH, BCPS, CPP Clinical Pharmacist (Rheumatology and Pulmonology)

## 2023-05-23 LAB — CBC WITH DIFFERENTIAL/PLATELET
Absolute Monocytes: 449 cells/uL (ref 200–950)
Basophils Absolute: 33 cells/uL (ref 0–200)
Basophils Relative: 0.5 %
Eosinophils Absolute: 130 cells/uL (ref 15–500)
Eosinophils Relative: 2 %
HCT: 42.7 % (ref 38.5–50.0)
Hemoglobin: 15 g/dL (ref 13.2–17.1)
Lymphs Abs: 2093 cells/uL (ref 850–3900)
MCH: 32.7 pg (ref 27.0–33.0)
MCHC: 35.1 g/dL (ref 32.0–36.0)
MCV: 93 fL (ref 80.0–100.0)
MPV: 9.9 fL (ref 7.5–12.5)
Monocytes Relative: 6.9 %
Neutro Abs: 3796 cells/uL (ref 1500–7800)
Neutrophils Relative %: 58.4 %
Platelets: 254 10*3/uL (ref 140–400)
RBC: 4.59 10*6/uL (ref 4.20–5.80)
RDW: 13 % (ref 11.0–15.0)
Total Lymphocyte: 32.2 %
WBC: 6.5 10*3/uL (ref 3.8–10.8)

## 2023-05-23 LAB — COMPLETE METABOLIC PANEL WITH GFR
AG Ratio: 2.7 (calc) — ABNORMAL HIGH (ref 1.0–2.5)
ALT: 18 U/L (ref 9–46)
AST: 21 U/L (ref 10–40)
Albumin: 4.9 g/dL (ref 3.6–5.1)
Alkaline phosphatase (APISO): 49 U/L (ref 36–130)
BUN: 15 mg/dL (ref 7–25)
CO2: 27 mmol/L (ref 20–32)
Calcium: 9.4 mg/dL (ref 8.6–10.3)
Chloride: 106 mmol/L (ref 98–110)
Creat: 1.07 mg/dL (ref 0.60–1.26)
Globulin: 1.8 g/dL (calc) — ABNORMAL LOW (ref 1.9–3.7)
Glucose, Bld: 96 mg/dL (ref 65–99)
Potassium: 4.3 mmol/L (ref 3.5–5.3)
Sodium: 141 mmol/L (ref 135–146)
Total Bilirubin: 0.5 mg/dL (ref 0.2–1.2)
Total Protein: 6.7 g/dL (ref 6.1–8.1)
eGFR: 91 mL/min/{1.73_m2} (ref >=60)

## 2023-05-23 LAB — PROTEIN ELECTROPHORESIS, SERUM, WITH REFLEX
Albumin ELP: 4.5 g/dL (ref 3.8–4.8)
Alpha 1: 0.2 g/dL (ref 0.2–0.3)
Alpha 2: 0.5 g/dL (ref 0.5–0.9)
Beta 2: 0.3 g/dL (ref 0.2–0.5)
Beta Globulin: 0.4 g/dL (ref 0.4–0.6)
Gamma Globulin: 0.7 g/dL — ABNORMAL LOW (ref 0.8–1.7)
Total Protein: 6.7 g/dL (ref 6.1–8.1)

## 2023-05-23 LAB — IFE INTERPRETATION

## 2023-05-23 LAB — SEDIMENTATION RATE: Sed Rate: 2 mm/h (ref 0–15)

## 2023-05-23 LAB — C-REACTIVE PROTEIN: CRP: <3 mg/L (ref <8.0)

## 2023-05-23 NOTE — Progress Notes (Signed)
IFE normal pattern

## 2023-06-29 ENCOUNTER — Other Ambulatory Visit: Payer: Self-pay | Admitting: Internal Medicine

## 2023-06-29 DIAGNOSIS — F411 Generalized anxiety disorder: Secondary | ICD-10-CM

## 2023-07-18 ENCOUNTER — Encounter: Payer: Self-pay | Admitting: Internal Medicine

## 2023-07-30 ENCOUNTER — Encounter: Payer: Self-pay | Admitting: Internal Medicine

## 2023-07-30 ENCOUNTER — Ambulatory Visit (INDEPENDENT_AMBULATORY_CARE_PROVIDER_SITE_OTHER): Payer: Commercial Managed Care - HMO | Admitting: Internal Medicine

## 2023-07-30 VITALS — BP 124/84 | HR 63 | Temp 98.0°F | Ht 75.0 in | Wt 239.4 lb

## 2023-07-30 DIAGNOSIS — Z0000 Encounter for general adult medical examination without abnormal findings: Secondary | ICD-10-CM

## 2023-07-30 DIAGNOSIS — Z23 Encounter for immunization: Secondary | ICD-10-CM

## 2023-07-30 DIAGNOSIS — F411 Generalized anxiety disorder: Secondary | ICD-10-CM

## 2023-07-30 MED ORDER — ESCITALOPRAM OXALATE 10 MG PO TABS: 10.0000 mg | ORAL_TABLET | Freq: Every day | ORAL | 0 refills | Status: DC

## 2023-07-30 NOTE — Progress Notes (Signed)
Subjective:  Patient ID: Jeffery Holt, male    DOB: [birth year]  Age: 37 y.o. MRN: [id number]  CC: Annual Exam   HPI Jeffery Janish presents for a CPX and f/up ----  Discussed the use of AI scribe software for clinical note transcription with the patient, who gave verbal consent to proceed.  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 He reports a persistent worry about his future, but denies any suicidal ideation, changes in sleep, weight, or appetite. He has been considering increasing his dose of Lexapro from 5 to 10 mg to help manage his anxiety.  The patient also reports occasional flare-ups of uveitis, which present as back pain or gastrointestinal distress, but states that Humira has been effective in managing these symptoms. He has not had an eye exam this year, but his last exam did not reveal any cause for concern. He does not use eye drops regularly, as recommended by his ophthalmologist.  He denies any chest pain, abdominal pain, or signs of testicular cancer.  The patient also mentions a recent minor illness with symptoms of congestion and a sore throat, but these symptoms have since resolved.       Outpatient Medications Prior to Visit  Medication Sig Dispense Refill   HUMIRA, 2 PEN, 40 MG/0.4ML pen INJECT 1 PEN SUBCUTANEOUSLY EVERY TWO WEEKS 6 each 0   escitalopram (LEXAPRO) 5 MG tablet TAKE 1 TABLET(5 MG) BY MOUTH DAILY 90 tablet 3   No facility-administered medications prior to visit.    ROS Review of Systems  Constitutional:  Negative for chills, diaphoresis, fatigue and fever.  HENT: Negative.    Eyes:  Positive for redness. Negative for visual disturbance.  Respiratory:  Negative for cough, chest tightness and wheezing.   Cardiovascular:  Negative for chest pain, palpitations and leg swelling.   Gastrointestinal:  Negative for abdominal pain, diarrhea, nausea and vomiting.  Genitourinary: Negative.  Negative for difficulty urinating, penile swelling, scrotal swelling and testicular pain.  Musculoskeletal: Negative.  Negative for arthralgias and myalgias.  Skin: Negative.   Neurological: Negative.  Negative for dizziness and weakness.  Hematological:  Negative for adenopathy. Does not bruise/bleed easily.  Psychiatric/Behavioral:  Positive for dysphoric mood. Negative for confusion, decreased concentration, sleep disturbance and suicidal ideas. The patient is nervous/anxious. The patient is not hyperactive.     Objective:  BP 124/84 (BP Location: Left Arm, Patient Position: Sitting, Cuff Size: Large)   Pulse 63   Temp 98 F (36.7 C) (Oral)   Ht 6\' 3"  (1.905 m)   Wt 239 lb 6.4 oz (108.6 kg)   SpO2 97%   BMI 29.92 kg/m   BP Readings from Last 3 Encounters:  07/30/23 124/84  03/27/23 (!) 132/90  10/22/22 120/82    Wt Readings from Last 3 Encounters:  07/30/23 239 lb 6.4 oz (108.6 kg)  03/27/23 236 lb (107 kg)  10/22/22 235 lb (106.6 kg)    Physical Exam Vitals reviewed.  HENT:     Mouth/Throat:     Mouth: Mucous membranes are moist.  Eyes:     General: No scleral icterus.    Conjunctiva/sclera:     Right eye: Right conjunctiva is injected. No exudate or hemorrhage.    Left eye: Left conjunctiva is injected. No exudate or hemorrhage. Cardiovascular:     Rate and Rhythm: Normal rate  and regular rhythm.     Heart sounds: No murmur heard. Pulmonary:     Effort: Pulmonary effort is normal.     Breath sounds: No stridor. No wheezing, rhonchi or rales.  Abdominal:     General: Abdomen is flat.     Palpations: There is no mass.     Tenderness: There is no abdominal tenderness. There is no guarding.     Hernia: No hernia is present.  Musculoskeletal:        General: Normal range of motion.     Cervical back: Neck supple.     Right lower leg: No edema.     Left  lower leg: No edema.  Lymphadenopathy:     Cervical: No cervical adenopathy.  Skin:    General: Skin is warm and dry.     Findings: No rash.  Neurological:     General: No focal deficit present.     Mental Status: He is alert. Mental status is at baseline.  Psychiatric:        Mood and Affect: Mood normal.        Behavior: Behavior normal.     Lab Results  Component Value Date   WBC 6.5 05/14/2023   HGB 15.0 05/14/2023   HCT 42.7 05/14/2023   PLT 254 05/14/2023   GLUCOSE 96 05/14/2023   CHOL 135 03/29/2021   TRIG 100.0 03/29/2021   HDL 33.80 (L) 03/29/2021   LDLDIRECT 72.0 11/17/2019   LDLCALC 81 03/29/2021   ALT 18 05/14/2023   AST 21 05/14/2023   NA 141 05/14/2023   K 4.3 05/14/2023   CL 106 05/14/2023   CREATININE 1.07 05/14/2023   BUN 15 05/14/2023   CO2 27 05/14/2023   TSH 1.32 03/29/2021    CT Soft Tissue Neck W Contrast  Result Date: 04/11/2020 CLINICAL DATA:  37 year old male with intermittent swelling of the left neck. EXAM: CT NECK WITH CONTRAST TECHNIQUE: Multidetector CT imaging of the neck was performed using the standard protocol following the bolus administration of intravenous contrast. CONTRAST:  75mL OMNIPAQUE IOHEXOL 300 MG/ML  SOLN COMPARISON:  Neck ultrasound 10/13/2018. FINDINGS: Pharynx and larynx: There is mild asymmetry of the larynx (series 3, image 79) but no laryngeal mass or hyperenhancement. Pharyngeal soft tissue contours are within normal limits. Negative parapharyngeal and retropharyngeal spaces. Salivary glands: Negative sublingual space. Submandibular and parotid glands are within normal limits. Thyroid: Negative. Lymph nodes: There is a marked area of clinical concern along the left lateral neck on series 3, image 40. Directly beneath the skin marker is the inferior pole of the left parotid gland which appears within normal limits (coronal image 27). Just caudal to that level normal left level 2 lymph nodes measure up to 6 mm short axis  (series 3, image 49). Contralateral right level 2 lymph nodes also measure up to 6 mm short axis (sagittal image 50). And other bilateral lymph node stations also appear symmetric and within normal limits. No abnormally enlarged or heterogeneous lymph nodes. Vascular: Major vascular structures in the neck and at the skull base are patent and appear normal. The right vertebral artery is mildly dominant (normal variant). Limited intracranial: Negative. Visualized orbits: Negative. Mastoids and visualized paranasal sinuses: Clear. Skeleton: No osseous abnormality identified. Upper chest: Negative. IMPRESSION: Neck soft tissues and cervical lymph nodes are within normal limits. No mass or abnormality to correspond to the marked area of clinical concern. Recommend follow-up by clinical exam, with repeat Neck CT (IV contrast preferred) if  the area further enlarges or becomes painful. Electronically Signed   By: Odessa Fleming M.D.   On: 04/11/2020 23:23    Assessment & Plan:  Need for immunization against influenza -     Flu vaccine trivalent PF, 6mos and older(Flulaval,Afluria,Fluarix,Fluzone)  GAD (generalized anxiety disorder)- Will increase the SSRI dose. -     Escitalopram Oxalate; Take 1 tablet (10 mg total) by mouth daily.  Dispense: 90 tablet; Refill: 0  Routine general medical examination at a health care facility - Exam completed, labs reviewed, vaccines reviewed and updated, no cancer screenings indicated, pt ed material was given.      Follow-up: Return in about 1 year (around 07/29/2024).  Sanda Linger, MD

## 2023-07-30 NOTE — Patient Instructions (Signed)
Health Maintenance, Male Adopting a healthy lifestyle and getting preventive care are important in promoting health and wellness. Ask your health care provider about: The right schedule for you to have regular tests and exams. Things you can do on your own to prevent diseases and keep yourself healthy. What should I know about diet, weight, and exercise? Eat a healthy diet  Eat a diet that includes plenty of vegetables, fruits, low-fat dairy products, and lean protein. Do not eat a lot of foods that are high in solid fats, added sugars, or sodium. Maintain a healthy weight Body mass index (BMI) is a measurement that can be used to identify possible weight problems. It estimates body fat based on height and weight. Your health care provider can help determine your BMI and help you achieve or maintain a healthy weight. Get regular exercise Get regular exercise. This is one of the most important things you can do for your health. Most adults should: Exercise for at least 150 minutes each week. The exercise should increase your heart rate and make you sweat (moderate-intensity exercise). Do strengthening exercises at least twice a week. This is in addition to the moderate-intensity exercise. Spend less time sitting. Even light physical activity can be beneficial. Watch cholesterol and blood lipids Have your blood tested for lipids and cholesterol at 39 years of age, then have this test every 5 years. You may need to have your cholesterol levels checked more often if: Your lipid or cholesterol levels are high. You are older than 40 years of age. You are at high risk for heart disease. What should I know about cancer screening? Many types of cancers can be detected early and may often be prevented. Depending on your health history and family history, you may need to have cancer screening at various ages. This may include screening for: Colorectal cancer. Prostate cancer. Skin cancer. Lung  cancer. What should I know about heart disease, diabetes, and high blood pressure? Blood pressure and heart disease High blood pressure causes heart disease and increases the risk of stroke. This is more likely to develop in people who have high blood pressure readings or are overweight. Talk with your health care provider about your target blood pressure readings. Have your blood pressure checked: Every 3-5 years if you are 18-39 years of age. Every year if you are 40 years old or older. If you are between the ages of 65 and 75 and are a current or former smoker, ask your health care provider if you should have a one-time screening for abdominal aortic aneurysm (AAA). Diabetes Have regular diabetes screenings. This checks your fasting blood sugar level. Have the screening done: Once every three years after age 45 if you are at a normal weight and have a low risk for diabetes. More often and at a younger age if you are overweight or have a high risk for diabetes. What should I know about preventing infection? Hepatitis B If you have a higher risk for hepatitis B, you should be screened for this virus. Talk with your health care provider to find out if you are at risk for hepatitis B infection. Hepatitis C Blood testing is recommended for: Everyone born from 1945 through 1965. Anyone with known risk factors for hepatitis C. Sexually transmitted infections (STIs) You should be screened each year for STIs, including gonorrhea and chlamydia, if: You are sexually active and are younger than 39 years of age. You are older than 39 years of age and your   health care provider tells you that you are at risk for this type of infection. Your sexual activity has changed since you were last screened, and you are at increased risk for chlamydia or gonorrhea. Ask your health care provider if you are at risk. Ask your health care provider about whether you are at high risk for HIV. Your health care provider  may recommend a prescription medicine to help prevent HIV infection. If you choose to take medicine to prevent HIV, you should first get tested for HIV. You should then be tested every 3 months for as long as you are taking the medicine. Follow these instructions at home: Alcohol use Do not drink alcohol if your health care provider tells you not to drink. If you drink alcohol: Limit how much you have to 0-2 drinks a day. Know how much alcohol is in your drink. In the [place] one drink equals one 12 oz bottle of beer (355 mL), one 5 oz glass of wine (148 mL), or one 1 oz glass of hard liquor (44 mL). Lifestyle Do not use any products that contain nicotine or tobacco. These products include cigarettes, chewing tobacco, and vaping devices, such as e-cigarettes. If you need help quitting, ask your health care provider. Do not use street drugs. Do not share needles. Ask your health care provider for help if you need support or information about quitting drugs. General instructions Schedule regular health, dental, and eye exams. Stay current with your vaccines. Tell your health care provider if: You often feel depressed. You have ever been abused or do not feel safe at home. Summary Adopting a healthy lifestyle and getting preventive care are important in promoting health and wellness. Follow your health care provider's instructions about healthy diet, exercising, and getting tested or screened for diseases. Follow your health care provider's instructions on monitoring your cholesterol and blood pressure. This information is not intended to replace advice given to you by your health care provider. Make sure you discuss any questions you have with your health care provider. Document Revised: 01/09/2021 Document Reviewed: 01/09/2021 Elsevier Patient Education  2024 [employer] Inc[employer]  

## 2023-08-06 NOTE — Progress Notes (Deleted)
Office Visit Note  Patient: Jeffery Holt             Date of Birth: 1986/04/03           MRN: [id number]             PCP: [name], MD Referring: [name], MD Visit Date: 08/20/2023 Occupation: @GUAROCC @  Subjective:    History of Present Illness: Jeffery Yoshimura is a 37 y.o. male with history of spondyloarthropathy and uveitis.  Patient remains on Humira 40 mg sq injections every 14 days-initiated on 07/13/21    CBC and CMP updated on 05/14/23. Orders for CBC and CMP released today.   TB gold negative on 10/30/22. Discussed the importance of holding humira if he develops signs or symptoms of an infection and to resume once the infection has completely cleared   Activities of Daily Living:  Patient reports morning stiffness for *** {minute/hour:19697}.   Patient {ACTIONS;DENIES/REPORTS:21021675::"Denies"} nocturnal pain.  Difficulty dressing/grooming: {ACTIONS;DENIES/REPORTS:21021675::"Denies"} Difficulty climbing stairs: {ACTIONS;DENIES/REPORTS:21021675::"Denies"} Difficulty getting out of chair: {ACTIONS;DENIES/REPORTS:21021675::"Denies"} Difficulty using hands for taps, buttons, cutlery, and/or writing: {ACTIONS;DENIES/REPORTS:21021675::"Denies"}  No Rheumatology ROS completed.   PMFS History:  Patient Active Problem List   Diagnosis Date Noted   GAD (generalized anxiety disorder) 03/29/2021   Uveitis, intermediate, bilateral 03/29/2021   Routine general medical examination at a health care facility 11/17/2019   Patellar tendonitis of both knees 03/14/2017   Allergic rhinitis 07/12/2014   GERD (gastroesophageal reflux disease) 01/23/2013    Past Medical History:  Diagnosis Date   Allergy    GERD (gastroesophageal reflux disease)    Petit mal (HCC)    as an infant    Uveitis    per patient, dx by Dr. Dione Booze     Family History  Problem Relation Age of Onset   Rheum arthritis Mother    Parkinson's disease Mother    Arthritis/Rheumatoid Mother    Tremor Mother     Kidney Stones Father    Arthritis Maternal Grandmother    Rheum arthritis Paternal Grandmother    Alcohol abuse Neg Hx    Cancer Neg Hx    COPD Neg Hx    Depression Neg Hx    Diabetes Neg Hx    Drug abuse Neg Hx    Early death Neg Hx    Hearing loss Neg Hx    Heart disease Neg Hx    Hyperlipidemia Neg Hx    Hypertension Neg Hx    Kidney disease Neg Hx    Stroke Neg Hx    Colon cancer Neg Hx    Past Surgical History:  Procedure Laterality Date   COLONOSCOPY  06/12/2021   Tooth implant Left 10/14/2014   Social History   Social History Narrative   Not on file   Immunization History  Administered Date(s) Administered   Influenza Whole 06/22/2014   Influenza, Seasonal, Injecte, Preservative Fre 08/10/2013, 07/30/2023   Influenza,inj,Quad PF,6+ Mos 06/26/2016, 07/02/2017, 05/27/2018, 05/19/2019, 06/28/2020, 05/21/2022   Moderna Sars-Covid-2 Vaccination 10/30/2019, 11/27/2019   PFIZER(Purple Top)SARS-COV-2 Vaccination 07/29/2020   Tdap 02/11/2015     Objective: Vital Signs: There were no vitals taken for this visit.   Physical Exam Vitals and nursing note reviewed.  Constitutional:      Appearance: He is well-developed.  HENT:     Head: Normocephalic and atraumatic.  Eyes:     Conjunctiva/sclera: Conjunctivae normal.     Pupils: Pupils are equal, round, and reactive to light.  Cardiovascular:  Rate and Rhythm: Normal rate and regular rhythm.     Heart sounds: Normal heart sounds.  Pulmonary:     Effort: Pulmonary effort is normal.     Breath sounds: Normal breath sounds.  Abdominal:     General: Bowel sounds are normal.     Palpations: Abdomen is soft.  Musculoskeletal:     Cervical back: Normal range of motion and neck supple.  Skin:    General: Skin is warm and dry.     Capillary Refill: Capillary refill takes less than 2 seconds.  Neurological:     Mental Status: He is alert and oriented to person, place, and time.  Psychiatric:        Behavior:  Behavior normal.      Musculoskeletal Exam: ***  CDAI Exam: CDAI Score: -- Patient Global: --; Provider Global: -- Swollen: --; Tender: -- Joint Exam 08/20/2023   No joint exam has been documented for this visit   There is currently no information documented on the homunculus. Go to the Rheumatology activity and complete the homunculus joint exam.  Investigation: No additional findings.  Imaging: No results found.  Recent Labs: Lab Results  Component Value Date   WBC 6.5 05/14/2023   HGB 15.0 05/14/2023   PLT 254 05/14/2023   NA 141 05/14/2023   K 4.3 05/14/2023   CL 106 05/14/2023   CO2 27 05/14/2023   GLUCOSE 96 05/14/2023   BUN 15 05/14/2023   CREATININE 1.07 05/14/2023   BILITOT 0.5 05/14/2023   ALKPHOS 54 11/17/2019   AST 21 05/14/2023   ALT 18 05/14/2023   PROT 6.7 05/14/2023   PROT 6.7 05/14/2023   ALBUMIN 4.4 11/17/2019   CALCIUM 9.4 05/14/2023   GFRAA >89 09/22/2015   QFTBGOLDPLUS NEGATIVE 10/30/2022    Speciality Comments: Humira started July 13, 2021  Procedures:  No procedures performed Allergies: Patient has no known allergies.   Assessment / Plan:     Visit Diagnoses: Spondyloarthropathy  High risk medication use  HLA B27 positive  Uveitis  Lymphadenopathy  Family history of lymphoma  Elevated serum creatinine  Ileitis  Patellar tendonitis of both knees  Sacroiliitis (HCC)  History of gastroesophageal reflux (GERD)  Dyslipidemia  Seasonal allergic rhinitis due to other allergic trigger  Orders: No orders of the defined types were placed in this encounter.  No orders of the defined types were placed in this encounter.   Face-to-face time spent with patient was *** minutes. Greater than 50% of time was spent in counseling and coordination of care.  Follow-Up Instructions: No follow-ups on file.   Gearldine Bienenstock, PA-C  Note - This record has been created using Dragon software.  Chart creation errors have been  sought, but may not always  have been located. Such creation errors do not reflect on  the standard of medical care.

## 2023-08-09 ENCOUNTER — Other Ambulatory Visit: Payer: Self-pay | Admitting: Physician Assistant

## 2023-08-09 DIAGNOSIS — H209 Unspecified iridocyclitis: Secondary | ICD-10-CM

## 2023-08-09 DIAGNOSIS — M47819 Spondylosis without myelopathy or radiculopathy, site unspecified: Secondary | ICD-10-CM

## 2023-08-09 NOTE — Telephone Encounter (Signed)
Last Fill: 05/13/2023  Labs: 05/14/2023 CBC and CMP normal.   TB Gold: 10/30/2022 negative    Next Visit: 08/20/2023  Last Visit: 03/27/2023  [id number]   Current Dose per office note 03/27/2023: Humira 40 mg sq injections every 14 days   Patient to update labs at upcoming appointment on 08/20/2023  Okay to refill Humira?

## 2023-08-20 ENCOUNTER — Ambulatory Visit: Payer: Commercial Managed Care - HMO | Admitting: Physician Assistant

## 2023-08-20 DIAGNOSIS — K529 Noninfective gastroenteritis and colitis, unspecified: Secondary | ICD-10-CM

## 2023-08-20 DIAGNOSIS — M47819 Spondylosis without myelopathy or radiculopathy, site unspecified: Secondary | ICD-10-CM

## 2023-08-20 DIAGNOSIS — J3089 Other allergic rhinitis: Secondary | ICD-10-CM

## 2023-08-20 DIAGNOSIS — M461 Sacroiliitis, not elsewhere classified: Secondary | ICD-10-CM

## 2023-08-20 DIAGNOSIS — R591 Generalized enlarged lymph nodes: Secondary | ICD-10-CM

## 2023-08-20 DIAGNOSIS — H209 Unspecified iridocyclitis: Secondary | ICD-10-CM

## 2023-08-20 DIAGNOSIS — M7651 Patellar tendinitis, right knee: Secondary | ICD-10-CM

## 2023-08-20 DIAGNOSIS — Z807 Family history of other malignant neoplasms of lymphoid, hematopoietic and related tissues: Secondary | ICD-10-CM

## 2023-08-20 DIAGNOSIS — Z1589 Genetic susceptibility to other disease: Secondary | ICD-10-CM

## 2023-08-20 DIAGNOSIS — R7989 Other specified abnormal findings of blood chemistry: Secondary | ICD-10-CM

## 2023-08-20 DIAGNOSIS — E785 Hyperlipidemia, unspecified: Secondary | ICD-10-CM

## 2023-08-20 DIAGNOSIS — Z79899 Other long term (current) drug therapy: Secondary | ICD-10-CM

## 2023-08-20 DIAGNOSIS — Z8719 Personal history of other diseases of the digestive system: Secondary | ICD-10-CM

## 2023-08-20 NOTE — Progress Notes (Deleted)
Office Visit Note  Patient: Jeffery Holt             Date of Birth: Dec 03, 1985           MRN: [id number]             PCP: [name], MD Referring: [name], MD Visit Date: 08/26/2023 Occupation: @GUAROCC @  Subjective:    History of Present Illness: Jeffery Holt is a 37 y.o. male with history of spondyloarthropathy and uveitis.  Patient remains on Humira 40 mg sq injections every 14 days-initiated on 07/13/21    CBC and CMP updated on 05/14/23. Orders for CBC and CMP released today.   TB gold negative on 10/30/22. Discussed the importance of holding humira if he develops signs or symptoms of an infection and to resume once the infection has completely cleared   Activities of Daily Living:  Patient reports morning stiffness for *** {minute/hour:19697}.   Patient {ACTIONS;DENIES/REPORTS:21021675::"Denies"} nocturnal pain.  Difficulty dressing/grooming: {ACTIONS;DENIES/REPORTS:21021675::"Denies"} Difficulty climbing stairs: {ACTIONS;DENIES/REPORTS:21021675::"Denies"} Difficulty getting out of chair: {ACTIONS;DENIES/REPORTS:21021675::"Denies"} Difficulty using hands for taps, buttons, cutlery, and/or writing: {ACTIONS;DENIES/REPORTS:21021675::"Denies"}  No Rheumatology ROS completed.   PMFS History:  Patient Active Problem List   Diagnosis Date Noted  . GAD (generalized anxiety disorder) 03/29/2021  . Uveitis, intermediate, bilateral 03/29/2021  . Routine general medical examination at a health care facility 11/17/2019  . Patellar tendonitis of both knees 03/14/2017  . Allergic rhinitis 07/12/2014  . GERD (gastroesophageal reflux disease) 01/23/2013    Past Medical History:  Diagnosis Date  . Allergy   . GERD (gastroesophageal reflux disease)   . Petit mal (HCC)    as an infant   . Uveitis    per patient, dx by Dr. Dione Booze     Family History  Problem Relation Age of Onset  . Rheum arthritis Mother   . Parkinson's disease Mother   . Arthritis/Rheumatoid Mother   .  Tremor Mother   . Kidney Stones Father   . Arthritis Maternal Grandmother   . Rheum arthritis Paternal Grandmother   . Alcohol abuse Neg Hx   . Cancer Neg Hx   . COPD Neg Hx   . Depression Neg Hx   . Diabetes Neg Hx   . Drug abuse Neg Hx   . Early death Neg Hx   . Hearing loss Neg Hx   . Heart disease Neg Hx   . Hyperlipidemia Neg Hx   . Hypertension Neg Hx   . Kidney disease Neg Hx   . Stroke Neg Hx   . Colon cancer Neg Hx    Past Surgical History:  Procedure Laterality Date  . COLONOSCOPY  06/12/2021  . Tooth implant Left 10/14/2014   Social History   Social History Narrative  . Not on file   Immunization History  Administered Date(s) Administered  . Influenza Whole 06/22/2014  . Influenza, Seasonal, Injecte, Preservative Fre 08/10/2013, 07/30/2023  . Influenza,inj,Quad PF,6+ Mos 06/26/2016, 07/02/2017, 05/27/2018, 05/19/2019, 06/28/2020, 05/21/2022  . Moderna Sars-Covid-2 Vaccination 10/30/2019, 11/27/2019  . PFIZER(Purple Top)SARS-COV-2 Vaccination 07/29/2020  . Tdap 02/11/2015     Objective: Vital Signs: There were no vitals taken for this visit.   Physical Exam Vitals and nursing note reviewed.  Constitutional:      Appearance: He is well-developed.  HENT:     Head: Normocephalic and atraumatic.  Eyes:     Conjunctiva/sclera: Conjunctivae normal.     Pupils: Pupils are equal, round, and reactive to light.  Cardiovascular:  Rate and Rhythm: Normal rate and regular rhythm.     Heart sounds: Normal heart sounds.  Pulmonary:     Effort: Pulmonary effort is normal.     Breath sounds: Normal breath sounds.  Abdominal:     General: Bowel sounds are normal.     Palpations: Abdomen is soft.  Musculoskeletal:     Cervical back: Normal range of motion and neck supple.  Skin:    General: Skin is warm and dry.     Capillary Refill: Capillary refill takes less than 2 seconds.  Neurological:     Mental Status: He is alert and oriented to person, place, and  time.  Psychiatric:        Behavior: Behavior normal.     Musculoskeletal Exam: ***  CDAI Exam: CDAI Score: -- Patient Global: --; Provider Global: -- Swollen: --; Tender: -- Joint Exam 08/26/2023   No joint exam has been documented for this visit   There is currently no information documented on the homunculus. Go to the Rheumatology activity and complete the homunculus joint exam.  Investigation: No additional findings.  Imaging: No results found.  Recent Labs: Lab Results  Component Value Date   WBC 6.5 05/14/2023   HGB 15.0 05/14/2023   PLT 254 05/14/2023   NA 141 05/14/2023   K 4.3 05/14/2023   CL 106 05/14/2023   CO2 27 05/14/2023   GLUCOSE 96 05/14/2023   BUN 15 05/14/2023   CREATININE 1.07 05/14/2023   BILITOT 0.5 05/14/2023   ALKPHOS 54 11/17/2019   AST 21 05/14/2023   ALT 18 05/14/2023   PROT 6.7 05/14/2023   PROT 6.7 05/14/2023   ALBUMIN 4.4 11/17/2019   CALCIUM 9.4 05/14/2023   GFRAA >89 09/22/2015   QFTBGOLDPLUS NEGATIVE 10/30/2022    Speciality Comments: Humira started July 13, 2021  Procedures:  No procedures performed Allergies: Patient has no known allergies.   Assessment / Plan:     Visit Diagnoses: Spondyloarthropathy  High risk medication use  HLA B27 positive  Uveitis  Elevated serum creatinine  Ileitis  Patellar tendonitis of both knees  Sacroiliitis (HCC)  History of gastroesophageal reflux (GERD)  Dyslipidemia  Seasonal allergic rhinitis due to other allergic trigger  Orders: No orders of the defined types were placed in this encounter.  No orders of the defined types were placed in this encounter.   Face-to-face time spent with patient was *** minutes. Greater than 50% of time was spent in counseling and coordination of care.  Follow-Up Instructions: No follow-ups on file.   Gearldine Bienenstock, PA-C  Note - This record has been created using Dragon software.  Chart creation errors have been sought, but  may not always  have been located. Such creation errors do not reflect on  the standard of medical care.

## 2023-08-26 ENCOUNTER — Ambulatory Visit: Payer: Self-pay | Admitting: Physician Assistant

## 2023-08-26 DIAGNOSIS — Z79899 Other long term (current) drug therapy: Secondary | ICD-10-CM

## 2023-08-26 DIAGNOSIS — Z8719 Personal history of other diseases of the digestive system: Secondary | ICD-10-CM

## 2023-08-26 DIAGNOSIS — M7651 Patellar tendinitis, right knee: Secondary | ICD-10-CM

## 2023-08-26 DIAGNOSIS — J3089 Other allergic rhinitis: Secondary | ICD-10-CM

## 2023-08-26 DIAGNOSIS — R7989 Other specified abnormal findings of blood chemistry: Secondary | ICD-10-CM

## 2023-08-26 DIAGNOSIS — H209 Unspecified iridocyclitis: Secondary | ICD-10-CM

## 2023-08-26 DIAGNOSIS — Z1589 Genetic susceptibility to other disease: Secondary | ICD-10-CM

## 2023-08-26 DIAGNOSIS — E785 Hyperlipidemia, unspecified: Secondary | ICD-10-CM

## 2023-08-26 DIAGNOSIS — M461 Sacroiliitis, not elsewhere classified: Secondary | ICD-10-CM

## 2023-08-26 DIAGNOSIS — K529 Noninfective gastroenteritis and colitis, unspecified: Secondary | ICD-10-CM

## 2023-08-26 DIAGNOSIS — M47819 Spondylosis without myelopathy or radiculopathy, site unspecified: Secondary | ICD-10-CM

## 2023-09-09 NOTE — Progress Notes (Signed)
 Office Visit Note  Patient: Jeffery Holt             Date of Birth: 1985-11-14           MRN: [id number]             PCP: Joshua Debby CROME, MD Referring: Joshua Debby CROME, MD Visit Date: 09/10/2023 Occupation: @GUAROCC @  Subjective:  Medication monitoring  History of Present Illness: Jeffery Holt is a 38 y.o. male with history of spondyloarthropathy and uveitis.  Patient remains on Humira  40 mg sq injections every 14 days-initiated on 07/13/21.  He is tolerating Humira  without any side effects or injection site reactions.  He has not had any recent gaps in therapy.  He denies any signs or symptoms of uveitis flare.  Patient has remained active exercising on a regular basis and has been also rockclimbing indoors.  His morning stiffness has been lasting for about 15 minutes daily.  He has noticed some increased generalized inflammation and bloating which he attributes to dietary changes around the holidays.  He denies any joint swelling or nocturnal pain at this time.  He denies any SI joint pain currently.  He denies any Achilles tendinitis or plantar fasciitis.  He denies any recent or recurrent infections.  Activities of Daily Living:  Patient reports morning stiffness for 15 minutes.   Patient Denies nocturnal pain.  Difficulty dressing/grooming: Denies Difficulty climbing stairs: Denies Difficulty getting out of chair: Denies Difficulty using hands for taps, buttons, cutlery, and/or writing: Denies  Review of Systems  Constitutional:  Positive for fatigue.  HENT: Negative.  Negative for mouth sores and mouth dryness.   Eyes: Negative.  Negative for dryness.  Respiratory: Negative.  Negative for shortness of breath.   Cardiovascular: Negative.  Negative for chest pain and palpitations.  Gastrointestinal: Negative.  Negative for blood in stool, constipation and diarrhea.  Endocrine: Negative.  Negative for increased urination.  Genitourinary: Negative.  Negative for involuntary urination.   Musculoskeletal:  Positive for joint pain and joint pain. Negative for gait problem, joint swelling, myalgias, muscle weakness, morning stiffness, muscle tenderness and myalgias.  Skin: Negative.  Negative for color change, rash, hair loss and sensitivity to sunlight.  Allergic/Immunologic: Negative.  Negative for susceptible to infections.  Neurological: Negative.  Negative for dizziness and headaches.  Hematological: Negative.  Negative for swollen glands.  Psychiatric/Behavioral: Negative.  Negative for depressed mood and sleep disturbance. The patient is not nervous/anxious.     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 Octavia     Family History  Problem Relation Age of Onset   Rheum arthritis Mother    Parkinson's disease Mother    Arthritis/Rheumatoid Mother    Tremor Mother    Kidney Stones Father    Arthritis Maternal Grandmother    Rheum arthritis Paternal Grandmother    Alcohol abuse Neg Hx  Cancer Neg Hx    COPD Neg Hx    Depression Neg Hx    Diabetes Neg Hx    Drug abuse Neg Hx    Early death Neg Hx    Hearing loss Neg Hx    Heart disease Neg Hx    Hyperlipidemia Neg Hx    Hypertension Neg Hx    Kidney disease Neg Hx    Stroke Neg Hx    Colon cancer Neg Hx    Past Surgical History:  Procedure Laterality Date   COLONOSCOPY  06/12/2021   Tooth implant Left 10/14/2014    Social History   Social History Narrative   Not on file   Immunization History  Administered Date(s) Administered   Influenza Whole 06/22/2014   Influenza, Seasonal, Injecte, Preservative Fre 08/10/2013, 07/30/2023   Influenza,inj,Quad PF,6+ Mos 06/26/2016, 07/02/2017, 05/27/2018, 05/19/2019, 06/28/2020, 05/21/2022   Moderna Sars-Covid-2 Vaccination 10/30/2019, 11/27/2019   PFIZER(Purple Top)SARS-COV-2 Vaccination 07/29/2020   Tdap 02/11/2015     Objective: Vital Signs: BP 117/75   Pulse 68   Resp 16   Ht 6' 3 (1.905 m)   Wt 241 lb (109.3 kg)   SpO2 98%   BMI 30.12 kg/m    Physical Exam Vitals and nursing note reviewed.  Constitutional:      Appearance: He is well-developed.  HENT:     Head: Normocephalic and atraumatic.  Eyes:     Conjunctiva/sclera: Conjunctivae normal.     Pupils: Pupils are equal, round, and reactive to light.  Cardiovascular:     Rate and Rhythm: Normal rate and regular rhythm.     Heart sounds: Normal heart sounds.  Pulmonary:     Effort: Pulmonary effort is normal.     Breath sounds: Normal breath sounds.  Abdominal:     General: Bowel sounds are normal.     Palpations: Abdomen is soft.  Musculoskeletal:     Cervical back: Normal range of motion and neck supple.  Skin:    General: Skin is warm and dry.     Capillary Refill: Capillary refill takes less than 2 seconds.  Neurological:     Mental Status: He is alert and oriented to person, place, and time.  Psychiatric:        Behavior: Behavior normal.      Musculoskeletal Exam: C-spine, thoracic spine, lumbar spine and good range of motion.  No midline spinal tenderness.  No SI joint tenderness.  Shoulder joints, elbow joints, wrist joints, MCPs, PIPs, DIPs have good range of motion with no synovitis.  No dactylitis noted.  Hip joints have slightly limited range of motion.  Knee joints have good range of motion no warmth or effusion.  Ankle joints have good range of motion with no  tenderness or joint swelling.  CDAI Exam: CDAI Score: -- Patient Global: --; Provider Global: -- Swollen: --; Tender: -- Joint Exam 09/10/2023   No joint exam has been documented for this visit   There is currently no information documented on the homunculus. Go to the Rheumatology activity and complete the homunculus joint exam.  Investigation: No additional findings.  Imaging: No results found.  Recent Labs: Lab Results  Component Value Date   WBC 6.5 05/14/2023   HGB 15.0 05/14/2023   PLT 254 05/14/2023   NA 141 05/14/2023   K 4.3 05/14/2023   CL 106 05/14/2023   CO2 27 05/14/2023   GLUCOSE 96 05/14/2023   BUN 15 05/14/2023   CREATININE 1.07 05/14/2023   BILITOT  0.5 05/14/2023   ALKPHOS 54 11/17/2019   AST 21 05/14/2023   ALT 18 05/14/2023   PROT 6.7 05/14/2023   PROT 6.7 05/14/2023   ALBUMIN 4.4 11/17/2019   CALCIUM 9.4 05/14/2023   GFRAA >89 09/22/2015   QFTBGOLDPLUS NEGATIVE 10/30/2022    Speciality Comments: Humira  started July 13, 2021  Procedures:  No procedures performed Allergies: Patient has no known allergies.   Assessment / Plan:     Visit Diagnoses: Spondyloarthropathy: He has not had any signs or symptoms of a flare.  He has no synovitis or dactylitis on examination today.  No midline spinal tenderness or SI joint tenderness.  No evidence of Achilles tendinitis or plantar fasciitis.  He has not had any signs or symptoms of uveitis.  Overall his symptoms have been clinically stable on Humira  40 mg subcu days injections every 14 days.  He continues to tolerate Humira  without any side effects or injection site reactions.  He has not had any recent or recurrent infections.  Patient will remain on Humira  as monotherapy.  He was advised to notify us  if he develops signs or symptoms of a flare.  He will follow-up in the office in 5 months or sooner if needed.  High risk medication use - Humira  40 mg sq injections every 14 days-initiated on 07/13/21.   CBC and CMP updated on 05/14/23.  Orders for CBC and CMP released today.  His next lab work will be due in April and every 3 months.  TB gold negative on 10/30/22. No recent or recurrent infections.  Discussed the importance of holding humira  if he develops signs or symptoms of an infection or resume once the infection has completely cleared.   Discussed the importance of yearly skin examinations while on Humira  due to the increased risk for nonmelanoma skin cancers. - Plan: COMPLETE METABOLIC PANEL WITH GFR, CBC with Differential/Platelet, QuantiFERON-TB Gold Plus  Screening for tuberculosis - Future order for TB gold placed today. Plan: QuantiFERON-TB Gold Plus  HLA B27 positive  Uveitis - HLA-B27 positive, history of recurrent uveitis.  Followed by Dr. Valdemar.  No signs or symptoms of a flare.   Elevated serum creatinine: Creatinine was 1.07 and GFR was 91 on 05/14/23.  Plan to recheck CMP with GFR today.   Ileitis: Not currently symptomatic.  He has noticed intermittent bloating but has not been able to identify a dietary trigger yet.   Patellar tendonitis of both knees: Not currently symptomatic.  He has been able to run off and on without difficulty.  No warmth or effusion noted on examination today.   Sacroiliitis [facility]): No SI joint tenderness upon palpation.  No nocturnal pain at this time.   Other medical conditions are listed as follows:   History of gastroesophageal reflux (GERD)  Lymphadenopathy: No cervical lymphadenopathy noted today.   Dyslipidemia  Seasonal allergic rhinitis due to other allergic trigger   Orders: Orders Placed This Encounter  Procedures   COMPLETE METABOLIC PANEL WITH GFR   CBC with Differential/Platelet   QuantiFERON-TB Gold Plus   No orders of the defined types were placed in this encounter.   Follow-Up Instructions: Return in about 5 months (around 02/08/2024) for Spondyloarthropathy, Uveitis.   Waddell CHRISTELLA Craze, PA-C  Note - This record  has been created using Dragon software.  Chart creation errors have been sought, but may not always  have been located. Such creation errors do not reflect on  the standard of medical care.

## 2023-09-10 ENCOUNTER — Ambulatory Visit: Payer: Commercial Managed Care - HMO | Attending: Physician Assistant | Admitting: Physician Assistant

## 2023-09-10 ENCOUNTER — Encounter: Payer: Self-pay | Admitting: Physician Assistant

## 2023-09-10 VITALS — BP 117/75 | HR 68 | Resp 16 | Ht 75.0 in | Wt 241.0 lb

## 2023-09-10 DIAGNOSIS — Z79899 Other long term (current) drug therapy: Secondary | ICD-10-CM

## 2023-09-10 DIAGNOSIS — Z8719 Personal history of other diseases of the digestive system: Secondary | ICD-10-CM

## 2023-09-10 DIAGNOSIS — M47819 Spondylosis without myelopathy or radiculopathy, site unspecified: Secondary | ICD-10-CM

## 2023-09-10 DIAGNOSIS — H209 Unspecified iridocyclitis: Secondary | ICD-10-CM | POA: Insufficient documentation

## 2023-09-10 DIAGNOSIS — K833 Fistula of bile duct: Secondary | ICD-10-CM | POA: Insufficient documentation

## 2023-09-10 DIAGNOSIS — Z1589 Genetic susceptibility to other disease: Secondary | ICD-10-CM

## 2023-09-10 DIAGNOSIS — R7989 Other specified abnormal findings of blood chemistry: Secondary | ICD-10-CM

## 2023-09-10 DIAGNOSIS — K9041 Non-celiac gluten sensitivity: Secondary | ICD-10-CM | POA: Insufficient documentation

## 2023-09-10 DIAGNOSIS — K529 Noninfective gastroenteritis and colitis, unspecified: Secondary | ICD-10-CM

## 2023-09-10 DIAGNOSIS — E785 Hyperlipidemia, unspecified: Secondary | ICD-10-CM

## 2023-09-10 DIAGNOSIS — Z111 Encounter for screening for respiratory tuberculosis: Secondary | ICD-10-CM

## 2023-09-10 DIAGNOSIS — M461 Sacroiliitis, not elsewhere classified: Secondary | ICD-10-CM

## 2023-09-10 DIAGNOSIS — R591 Generalized enlarged lymph nodes: Secondary | ICD-10-CM

## 2023-09-10 DIAGNOSIS — J3089 Other allergic rhinitis: Secondary | ICD-10-CM

## 2023-09-10 DIAGNOSIS — M7651 Patellar tendinitis, right knee: Secondary | ICD-10-CM

## 2023-09-10 DIAGNOSIS — M7652 Patellar tendinitis, left knee: Secondary | ICD-10-CM

## 2023-09-10 NOTE — Patient Instructions (Signed)
 Standing Labs We placed an order today for your standing lab work.   Please have your standing labs drawn in April and every 3 months   Please have your labs drawn 2 weeks prior to your appointment so that the provider can discuss your lab results at your appointment, if possible.  Please note that you may see your imaging and lab results in MyChart before we have reviewed them. We will contact you once all results are reviewed. Please allow our office up to 72 hours to thoroughly review all of the results before contacting the office for clarification of your results.  WALK-IN LAB HOURS  Monday through Thursday from 8:00 am -12:30 pm and 1:00 pm-5:00 pm and Friday from 8:00 am-12:00 pm.  Patients with office visits requiring labs will be seen before walk-in labs.  You may encounter longer than normal wait times. Please allow additional time. Wait times may be shorter on  Monday and Thursday afternoons.  We do not book appointments for walk-in labs. We appreciate your patience and understanding with our staff.   Labs are drawn by [facility]. Please bring your co-pay at the time of your lab draw.  You may receive a bill from [facility] for your lab work.  Please note if you are on Hydroxychloroquine and and an order has been placed for a Hydroxychloroquine level,  you will need to have it drawn 4 hours or more after your last dose.  If you wish to have your labs drawn at another location, please call the office 24 hours in advance so we can fax the orders.  The office is located at [address], [place], [place] [address]   If you have any questions regarding directions or hours of operation,  please call [phone].   As a reminder, please drink plenty of water prior to coming for your lab work. Thanks!

## 2023-09-11 LAB — CBC WITH DIFFERENTIAL/PLATELET
Absolute Lymphocytes: 2370 {cells}/uL (ref 850–3900)
Absolute Monocytes: 495 {cells}/uL (ref 200–950)
Basophils Absolute: 23 {cells}/uL (ref 0–200)
Basophils Relative: 0.3 %
Eosinophils Absolute: 120 {cells}/uL (ref 15–500)
Eosinophils Relative: 1.6 %
HCT: 44.1 % (ref 38.5–50.0)
Hemoglobin: 14.7 g/dL (ref 13.2–17.1)
MCH: 32 pg (ref 27.0–33.0)
MCHC: 33.3 g/dL (ref 32.0–36.0)
MCV: 95.9 fL (ref 80.0–100.0)
MPV: 9.8 fL (ref 7.5–12.5)
Monocytes Relative: 6.6 %
Neutro Abs: 4493 {cells}/uL (ref 1500–7800)
Neutrophils Relative %: 59.9 %
Platelets: 256 10*3/uL (ref 140–400)
RBC: 4.6 10*6/uL (ref 4.20–5.80)
RDW: 12.9 % (ref 11.0–15.0)
Total Lymphocyte: 31.6 %
WBC: 7.5 10*3/uL (ref 3.8–10.8)

## 2023-09-11 LAB — COMPLETE METABOLIC PANEL WITH GFR
AG Ratio: 2 (calc) (ref 1.0–2.5)
ALT: 18 U/L (ref 9–46)
AST: 23 U/L (ref 10–40)
Albumin: 4.6 g/dL (ref 3.6–5.1)
Alkaline phosphatase (APISO): 47 U/L (ref 36–130)
BUN: 18 mg/dL (ref 7–25)
CO2: 29 mmol/L (ref 20–32)
Calcium: 9.2 mg/dL (ref 8.6–10.3)
Chloride: 105 mmol/L (ref 98–110)
Creat: 1.19 mg/dL (ref 0.60–1.26)
Globulin: 2.3 g/dL (ref 1.9–3.7)
Glucose, Bld: 80 mg/dL (ref 65–99)
Potassium: 4.5 mmol/L (ref 3.5–5.3)
Sodium: 140 mmol/L (ref 135–146)
Total Bilirubin: 0.6 mg/dL (ref 0.2–1.2)
Total Protein: 6.9 g/dL (ref 6.1–8.1)
eGFR: 80 mL/min/{1.73_m2} (ref >=60)

## 2023-09-11 NOTE — Progress Notes (Signed)
 CBC and CMP WNL

## 2023-10-22 ENCOUNTER — Other Ambulatory Visit: Payer: Self-pay | Admitting: Physician Assistant

## 2023-10-22 DIAGNOSIS — M47819 Spondylosis without myelopathy or radiculopathy, site unspecified: Secondary | ICD-10-CM

## 2023-10-22 DIAGNOSIS — H209 Unspecified iridocyclitis: Secondary | ICD-10-CM

## 2023-10-22 NOTE — Telephone Encounter (Signed)
 Please schedule patient a follow up visit. Patient due June 2025. Thanks!   Follow-Up Instructions: Return in about 5 months (around 02/08/2024) for Spondyloarthropathy, Uveitis.

## 2023-10-22 NOTE — Telephone Encounter (Signed)
 Last Fill: 08/09/2023  Labs: 09/10/2023 CBC and CMP WNL   TB Gold: 10/30/2022 Neg    Next Visit: Due June 2025. Message sent to the front to schedule.   Last Visit: 09/10/2023  [id number]   Current Dose per office note 09/10/2023: Humira 40 mg sq injections every 14 days   Okay to refill Humira?

## 2023-10-28 ENCOUNTER — Other Ambulatory Visit: Payer: Self-pay | Admitting: Internal Medicine

## 2023-10-28 DIAGNOSIS — F411 Generalized anxiety disorder: Secondary | ICD-10-CM

## 2023-11-07 ENCOUNTER — Telehealth: Payer: Self-pay | Admitting: Pharmacist

## 2023-11-07 DIAGNOSIS — H209 Unspecified iridocyclitis: Secondary | ICD-10-CM

## 2023-11-07 DIAGNOSIS — M47819 Spondylosis without myelopathy or radiculopathy, site unspecified: Secondary | ICD-10-CM

## 2023-11-07 NOTE — Telephone Encounter (Signed)
 Patietn has had insurance change.  Submitted a Prior Authorization request to [facility] for HUMIRA via CoverMyMeds. Will update once we receive a response.  Key: [id number]

## 2023-11-08 NOTE — Telephone Encounter (Signed)
 Received fax from [facility] stating Humira is denied because must submit documentation showing trial and failure of two NSAID.  Patient has spondyloarthropathy AND uveitis. NSAIDs are not used for uveitis.  Patient has failed ibuprofen in past.  Clinicals submitted for Provider Courtesy Review  Fax: [phone] Phone: [phone], extension [phone] Case # [id number]

## 2023-11-13 ENCOUNTER — Other Ambulatory Visit (HOSPITAL_COMMUNITY): Payer: Self-pay

## 2023-11-13 ENCOUNTER — Other Ambulatory Visit: Payer: Self-pay | Admitting: Pharmacist

## 2023-11-13 MED ORDER — HUMIRA (2 PEN) 40 MG/0.4ML ~~LOC~~ AJKT
AUTO-INJECTOR | SUBCUTANEOUS | 0 refills | Status: DC
  Filled 2023-11-25: qty 2, 28d supply, fill #0

## 2023-11-13 NOTE — Telephone Encounter (Signed)
 Received notification from [facility] regarding a prior authorization for HUMIRA. Authorization has been APPROVED from 11/07/2023 to 11/06/2024. Approval letter sent to scan center.  Per test claim, copay for 28 days supply is $5  Patient can fill through [facility]: [phone]   Authorization # [id number]  [facility]. They confirm that they are not contracted with his new insurance. Rx sent to [facility] for onboarding. MyChart message sent to pt to determine by when he will need the medication  Chesley Mires, PharmD, MPH, BCPS, CPP Clinical Pharmacist (Rheumatology and Pulmonology)

## 2023-11-13 NOTE — Progress Notes (Signed)
 Patient has had insurance change and must switch from pharmacies to [facility] for Humira. He has been clinically stable for some time.  Continue Humira 40mg  subcut every 14 days for spondyloarthropathy and uveitis as monotherapy.  Chesley Mires, PharmD, MPH, BCPS, CPP Clinical Pharmacist (Rheumatology and Pulmonology)

## 2023-11-25 ENCOUNTER — Other Ambulatory Visit (HOSPITAL_COMMUNITY): Payer: Self-pay

## 2023-11-25 ENCOUNTER — Other Ambulatory Visit: Payer: Self-pay

## 2023-11-25 NOTE — Progress Notes (Signed)
 Specialty Pharmacy Initial Fill Coordination Note  Jeffery Holt is a 38 y.o. male contacted today regarding initial fill of specialty medication(s) Adalimumab (Humira (2 Pen))   Patient requested Delivery   Delivery date: 11/27/23   Verified address: [address]   [place] [address]   Medication will be filled on 11/26/23.   Patient is aware of $5 copayment. Payment info collected and forwarded to Riva at [facility].

## 2023-11-26 ENCOUNTER — Other Ambulatory Visit: Payer: Self-pay

## 2023-12-12 ENCOUNTER — Other Ambulatory Visit: Payer: Self-pay

## 2023-12-12 ENCOUNTER — Other Ambulatory Visit: Payer: Self-pay | Admitting: Rheumatology

## 2023-12-12 DIAGNOSIS — M47819 Spondylosis without myelopathy or radiculopathy, site unspecified: Secondary | ICD-10-CM

## 2023-12-12 DIAGNOSIS — K529 Noninfective gastroenteritis and colitis, unspecified: Secondary | ICD-10-CM

## 2023-12-12 DIAGNOSIS — Z1589 Genetic susceptibility to other disease: Secondary | ICD-10-CM

## 2023-12-12 DIAGNOSIS — R7989 Other specified abnormal findings of blood chemistry: Secondary | ICD-10-CM

## 2023-12-12 DIAGNOSIS — Z79899 Other long term (current) drug therapy: Secondary | ICD-10-CM

## 2023-12-12 DIAGNOSIS — H209 Unspecified iridocyclitis: Secondary | ICD-10-CM

## 2023-12-12 MED ORDER — HUMIRA (2 PEN) 40 MG/0.4ML ~~LOC~~ AJKT
AUTO-INJECTOR | SUBCUTANEOUS | 0 refills | Status: DC
  Filled 2023-12-12: qty 2, 28d supply, fill #0

## 2023-12-12 NOTE — Progress Notes (Signed)
 Specialty Pharmacy Refill Coordination Note  Jeffery Holt is a 38 y.o. male contacted today regarding refills of specialty medication(s) Adalimumab (Humira (2 Pen))   Patient requested (Patient-Rptd) Delivery   Delivery date: (Patient-Rptd) 12/24/23   Verified address: (Patient-Rptd) [address], [place], [address]   Medication will be filled on 04.21.25.   Sent refill request, will notify patient if delayed.

## 2023-12-12 NOTE — Telephone Encounter (Signed)
 Last Fill:  11/13/2023 (30 day supply)   Labs: 09/10/2023 CBC and CMP WNL    TB Gold: 10/30/2022 Neg     Next Visit: 02/06/2024   Last Visit: 09/10/2023   [id number]    Current Dose per office note 09/10/2023: Humira 40 mg sq injections every 14 days   Left message to advise patient he is due to update labs.    Okay to refill Humira?

## 2023-12-13 ENCOUNTER — Other Ambulatory Visit: Payer: Self-pay

## 2023-12-19 ENCOUNTER — Other Ambulatory Visit: Payer: Self-pay | Admitting: *Deleted

## 2023-12-19 DIAGNOSIS — Z111 Encounter for screening for respiratory tuberculosis: Secondary | ICD-10-CM | POA: Diagnosis not present

## 2023-12-19 DIAGNOSIS — Z79899 Other long term (current) drug therapy: Secondary | ICD-10-CM

## 2023-12-21 LAB — CBC WITH DIFFERENTIAL/PLATELET
Absolute Lymphocytes: 2343 {cells}/uL (ref 850–3900)
Absolute Monocytes: 469 {cells}/uL (ref 200–950)
Basophils Absolute: 20 {cells}/uL (ref 0–200)
Basophils Relative: 0.3 %
Eosinophils Absolute: 119 {cells}/uL (ref 15–500)
Eosinophils Relative: 1.8 %
HCT: 42.7 % (ref 38.5–50.0)
Hemoglobin: 14.5 g/dL (ref 13.2–17.1)
MCH: 32.2 pg (ref 27.0–33.0)
MCHC: 34 g/dL (ref 32.0–36.0)
MCV: 94.9 fL (ref 80.0–100.0)
MPV: 9.7 fL (ref 7.5–12.5)
Monocytes Relative: 7.1 %
Neutro Abs: 3650 {cells}/uL (ref 1500–7800)
Neutrophils Relative %: 55.3 %
Platelets: 263 10*3/uL (ref 140–400)
RBC: 4.5 10*6/uL (ref 4.20–5.80)
RDW: 12.8 % (ref 11.0–15.0)
Total Lymphocyte: 35.5 %
WBC: 6.6 10*3/uL (ref 3.8–10.8)

## 2023-12-21 LAB — COMPREHENSIVE METABOLIC PANEL WITH GFR
AG Ratio: 2.4 (calc) (ref 1.0–2.5)
ALT: 16 U/L (ref 9–46)
AST: 21 U/L (ref 10–40)
Albumin: 4.5 g/dL (ref 3.6–5.1)
Alkaline phosphatase (APISO): 44 U/L (ref 36–130)
BUN: 16 mg/dL (ref 7–25)
CO2: 27 mmol/L (ref 20–32)
Calcium: 9.3 mg/dL (ref 8.6–10.3)
Chloride: 104 mmol/L (ref 98–110)
Creat: 1.21 mg/dL (ref 0.60–1.26)
Globulin: 1.9 g/dL (ref 1.9–3.7)
Glucose, Bld: 85 mg/dL (ref 65–99)
Potassium: 4.5 mmol/L (ref 3.5–5.3)
Sodium: 140 mmol/L (ref 135–146)
Total Bilirubin: 0.6 mg/dL (ref 0.2–1.2)
Total Protein: 6.4 g/dL (ref 6.1–8.1)
eGFR: 78 mL/min/{1.73_m2} (ref >=60)

## 2023-12-21 LAB — QUANTIFERON-TB GOLD PLUS
Mitogen-NIL: 8.32 [IU]/mL
NIL: 0.02 [IU]/mL
QuantiFERON-TB Gold Plus: NEGATIVE
TB1-NIL: 0 [IU]/mL
TB2-NIL: 0 [IU]/mL

## 2023-12-22 NOTE — Progress Notes (Signed)
 TB gold negative

## 2023-12-23 ENCOUNTER — Other Ambulatory Visit: Payer: Self-pay

## 2024-01-22 ENCOUNTER — Other Ambulatory Visit (HOSPITAL_COMMUNITY): Payer: Self-pay

## 2024-01-22 ENCOUNTER — Other Ambulatory Visit: Payer: Self-pay

## 2024-01-22 ENCOUNTER — Other Ambulatory Visit: Payer: Self-pay | Admitting: Rheumatology

## 2024-01-22 DIAGNOSIS — H209 Unspecified iridocyclitis: Secondary | ICD-10-CM

## 2024-01-22 DIAGNOSIS — M47819 Spondylosis without myelopathy or radiculopathy, site unspecified: Secondary | ICD-10-CM

## 2024-01-22 MED ORDER — HUMIRA (2 PEN) 40 MG/0.4ML ~~LOC~~ AJKT
AUTO-INJECTOR | SUBCUTANEOUS | 0 refills | Status: DC
  Filled 2024-01-22: qty 2, 28d supply, fill #0

## 2024-01-22 NOTE — Telephone Encounter (Signed)
 Last Fill: 12/12/2023  Labs: 12/19/2023 CBC and CMPWNL   TB Gold: 12/19/2023 TB gold negative   Next Visit: 02/06/2024  Last Visit: 09/10/2023  [id number]   Current Dose per office note 09/10/2023: Humira  40 mg subcu days injections every 14 days   Okay to refill Humira ?

## 2024-01-22 NOTE — Progress Notes (Signed)
 Specialty Pharmacy Refill Coordination Note  [name] Ebarb is a 38 y.o. male contacted today regarding refills of specialty medication(s) Adalimumab  (Humira  (2 Pen))   Patient requested (Patient-Rptd) Delivery   Delivery date: (Patient-Rptd) 01/31/24   Verified address: (Patient-Rptd) [address] [place], [place], [address]   Medication will be filled on 01/30/24.   This fill date is pending response to refill request from provider. Patient is aware and if they have not received fill by intended date they must follow up with pharmacy.

## 2024-01-23 NOTE — Progress Notes (Unsigned)
 Office Visit Note  Patient: Jeffery Holt             Date of Birth: [birth year]           MRN: [id number]             PCP: Arcadio Knuckles, MD Referring: Arcadio Knuckles, MD Visit Date: 02/06/2024 Occupation: @GUAROCC @  Subjective:  Plantar fasciitis of left foot  History of Present Illness: Jeffery Simerson is a 38 y.o. male with history of spondyloarthropathy and uveitis.  Patient remains on  Humira  40 mg sq injections every 14 days-initiated on 07/13/21.  Patient continues to tolerate Humira  without any side effects or injection site reactions.  He denies any signs or symptoms of uveitis flare.  He has noticed some stiffness with extension of the cervical spine as well as some postural changes while working at a desk.  He denies any SI joint pain at this time.  Patient remains active rockclimbing once a week, weightlifting, and jogging for exercise.  He recently tried playing soccer but developed pain in the plantar fascia of the left foot.  His symptoms have persisted over the past 2 to 3 weeks.  He symptoms are exacerbated by walking or jogging for long distances.  He has been trying some plantar fascia exercises at home.  He denies any Achilles tendinitis.  He denies any joint swelling at this time. He denies any recent or recurrent infections.    Activities of Daily Living:  Patient reports morning stiffness for 15-20 minutes.   Patient Denies nocturnal pain.  Difficulty dressing/grooming: Denies Difficulty climbing stairs: Denies Difficulty getting out of chair: Denies Difficulty using hands for taps, buttons, cutlery, and/or writing: Denies  Review of Systems  Constitutional:  Negative for fatigue.  HENT:  Negative for mouth sores and mouth dryness.   Eyes:  Negative for dryness.  Respiratory:  Negative for shortness of breath.   Cardiovascular:  Negative for chest pain and palpitations.  Gastrointestinal:  Negative for blood in stool, constipation and diarrhea.  Endocrine: Negative  for increased urination.  Genitourinary:  Negative for involuntary urination.  Musculoskeletal:  Positive for joint pain, joint pain, myalgias, morning stiffness, muscle tenderness and myalgias. Negative for gait problem, joint swelling and muscle weakness.  Skin:  Negative for color change, rash, hair loss and sensitivity to sunlight.  Allergic/Immunologic: Negative for susceptible to infections.  Neurological:  Negative for dizziness and headaches.  Hematological:  Positive for swollen glands.  Psychiatric/Behavioral:  Negative for depressed mood and sleep disturbance. The patient is nervous/anxious.     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 Candi Chafe     Family History  Problem Relation Age of Onset   Rheum arthritis Mother    Parkinson's disease Mother    Arthritis/Rheumatoid Mother    Tremor Mother    Kidney Stones Father  Arthritis Maternal Grandmother    Rheum arthritis Paternal Grandmother    Alcohol abuse Neg Hx    Cancer Neg Hx    COPD Neg Hx    Depression Neg Hx    Diabetes Neg Hx    Drug abuse Neg Hx    Early death Neg Hx    Hearing loss Neg Hx    Heart disease Neg Hx    Hyperlipidemia Neg Hx    Hypertension Neg Hx    Kidney disease Neg Hx    Stroke Neg Hx    Colon cancer Neg Hx    Past Surgical History:  Procedure Laterality Date   COLONOSCOPY  06/12/2021   Tooth  implant Left 10/14/2014   Social History   Social History Narrative   Not on file   Immunization History  Administered Date(s) Administered   Influenza Whole 06/22/2014   Influenza, Seasonal, Injecte, Preservative Fre 08/10/2013, 07/30/2023   Influenza,inj,Quad PF,6+ Mos 06/26/2016, 07/02/2017, 05/27/2018, 05/19/2019, 06/28/2020, 05/21/2022   Moderna Sars-Covid-2 Vaccination 10/30/2019, 11/27/2019   PFIZER(Purple Top)SARS-COV-2 Vaccination 07/29/2020   Tdap 02/11/2015     Objective: Vital Signs: BP 125/74 (BP Location: Left Arm, Patient Position: Sitting, Cuff Size: Large)   Pulse (!) 45   Resp 14   Ht 6\' 3"  (1.905 m)   Wt 234 lb (106.1 kg)   BMI 29.25 kg/m    Physical Exam Vitals and nursing note reviewed.  Constitutional:      Appearance: He is well-developed.  HENT:     Head: Normocephalic and atraumatic.  Eyes:     Conjunctiva/sclera: Conjunctivae normal.     Pupils: Pupils are equal, round, and reactive to light.  Cardiovascular:     Rate and Rhythm: Normal rate and regular rhythm.     Heart sounds: Normal heart sounds.  Pulmonary:     Effort: Pulmonary effort is normal.     Breath sounds: Normal breath sounds.  Abdominal:     General: Bowel sounds are normal.     Palpations: Abdomen is soft.  Musculoskeletal:     Cervical back: Normal range of motion and neck supple.  Skin:    General: Skin is warm and dry.     Capillary Refill: Capillary refill takes less than 2 seconds.  Neurological:     Mental Status: He is alert and oriented to person, place, and time.  Psychiatric:        Behavior: Behavior normal.      Musculoskeletal Exam: C-spine has good ROM with some stiffness with extension.  No midline spinal tenderness.  No SI joint tenderness.  Shoulder joints, elbow joints, wrist joints, MCPs, and DIPs good ROM with no synovitis.  Complete fist formation bilaterally.  Hip joints have good ROM with no groin pain.  Knee joints have good ROM with no warmth or  effusion.  Ankle joints have good ROM with no tenderness or joint swelling.  No evidence of achilles tendonitis.  Tenderness along the plantar fasciitis of the left foot.   CDAI Exam: CDAI Score: -- Patient Global: --; Provider Global: -- Swollen: --; Tender: -- Joint Exam 02/06/2024   No joint exam has been documented for this visit   There is currently no information documented on the homunculus. Go to the Rheumatology activity and complete the homunculus joint exam.  Investigation: No additional findings.  Imaging: No results found.  Recent Labs: Lab Results  Component Value Date   WBC 6.6 12/19/2023   HGB 14.5 12/19/2023   PLT 263  12/19/2023   NA 140 12/19/2023   K 4.5 12/19/2023   CL 104 12/19/2023   CO2 27 12/19/2023   GLUCOSE 85 12/19/2023   BUN 16 12/19/2023   CREATININE 1.21 12/19/2023   BILITOT 0.6 12/19/2023   ALKPHOS 54 11/17/2019   AST 21 12/19/2023   ALT 16 12/19/2023   PROT 6.4 12/19/2023   ALBUMIN 4.4 11/17/2019   CALCIUM 9.3 12/19/2023   GFRAA >89 09/22/2015   QFTBGOLDPLUS NEGATIVE 12/19/2023    Speciality Comments: Humira  started July 13, 2021  Procedures:  No procedures performed Allergies: Patient has no known allergies.    Assessment / Plan:     Visit Diagnoses: Spondyloarthropathy: No midline spinal tenderness or SI joint tenderness upon palpation.  He started to notice some increased stiffness with extension of the cervical spine but has good lateral rotation.  No synovitis or dactylitis was noted on examination today.  No evidence of Achilles tendinitis.  He has not had any signs or symptoms of a uveitis flare.  Overall he is clinically been doing well on Humira  40 mg sq injections every 14 days.  He is tolerating Humira  without any side effects or injection site reactions.  He remains active rockclimbing once a week, jogging, and weightlifting for exercise.  He recently tried playing soccer and has developed pain on the plantar fashion of  the left foot.  He has tried home exercises but his symptoms have persisted over the past 2 to 3 weeks.  He was given a handout of exercises to perform.  Discussed the use of Voltaren gel which she can apply topically for pain relief.  Discussed the option of physical therapy, cortisone injection, or wearing a night splint.  He will notify us  if his symptoms persist or worsen.  He was advised to notify us  if he develops any signs or symptoms of a flare.  He will follow-up in the office in 5 months or sooner if needed.  High risk medication use - Humira  40 mg sq injections every 14 days-initiated on 07/13/21. CBC and CMP WNL on 12/19/23. His next lab work will be due in mid-July and every 3 months. TB gold negative on 12/19/23.  No recent or recurrent infections. Discussed the importance of holding humira  if he develops signs or symptoms of an infection and to resume once the infections have completely cleared.    Uveitis - HLA-B27 positive, history of recurrent uveitis.  Followed by Dr. Karyl Paget. No signs or symptoms of a uveitis flare.   HLA B27 positive  Elevated serum creatinine: Creatinine was 1.21 and GFR was 78 on 12/19/2023--we will continue to monitor.  Ileitis: No signs or symptoms of a flare.  Plantar fasciitis of left foot: Patient presents today with new onset plantar fasciitis involving the left foot for the past 2 to 3 weeks.  He was playing soccer which she feels exacerbated his symptoms.  He has been trying some home exercises but his symptoms have persisted.  Discussed the use of orthotics, night splint, cortisone injection, physical therapy, topical agents, and home exercises. He was given a handout of exercises to perform.  He was advised to notify us  if his symptoms persist or worsen.  Discussed the importance of wearing proper fitting shoes.  Patellar tendonitis of both knees: Good range of motion of both knee joints on examination today.  No warmth or effusion  noted.  Sacroiliitis [facility]): No SI joint tenderness upon palpation.  No nocturnal pain.  Other medical conditions are listed  as follows:   History of gastroesophageal reflux (GERD)  Lymphadenopathy  Dyslipidemia  Seasonal allergic rhinitis due to other allergic trigger  Family history of lymphoma  Orders: No orders of the defined types were placed in this encounter.  No orders of the defined types were placed in this encounter.    Follow-Up Instructions: Return in about 5 months (around 07/08/2024) for Spondyloarthropathy, Uveitis.   Romayne Clubs, PA-C  Note - This record has been created using Dragon software.  Chart creation errors have been sought, but may not always  have been located. Such creation errors do not reflect on  the standard of medical care.

## 2024-01-30 ENCOUNTER — Other Ambulatory Visit: Payer: Self-pay

## 2024-02-06 ENCOUNTER — Encounter: Payer: Self-pay | Admitting: Physician Assistant

## 2024-02-06 ENCOUNTER — Ambulatory Visit: Payer: Commercial Managed Care - HMO | Attending: Physician Assistant | Admitting: Physician Assistant

## 2024-02-06 VITALS — BP 125/74 | HR 45 | Resp 14 | Ht 75.0 in | Wt 234.0 lb

## 2024-02-06 DIAGNOSIS — Z1589 Genetic susceptibility to other disease: Secondary | ICD-10-CM

## 2024-02-06 DIAGNOSIS — Z807 Family history of other malignant neoplasms of lymphoid, hematopoietic and related tissues: Secondary | ICD-10-CM

## 2024-02-06 DIAGNOSIS — H209 Unspecified iridocyclitis: Secondary | ICD-10-CM | POA: Diagnosis not present

## 2024-02-06 DIAGNOSIS — R591 Generalized enlarged lymph nodes: Secondary | ICD-10-CM

## 2024-02-06 DIAGNOSIS — M47819 Spondylosis without myelopathy or radiculopathy, site unspecified: Secondary | ICD-10-CM

## 2024-02-06 DIAGNOSIS — Z79899 Other long term (current) drug therapy: Secondary | ICD-10-CM | POA: Diagnosis not present

## 2024-02-06 DIAGNOSIS — M722 Plantar fascial fibromatosis: Secondary | ICD-10-CM

## 2024-02-06 DIAGNOSIS — J3089 Other allergic rhinitis: Secondary | ICD-10-CM

## 2024-02-06 DIAGNOSIS — M7651 Patellar tendinitis, right knee: Secondary | ICD-10-CM

## 2024-02-06 DIAGNOSIS — E785 Hyperlipidemia, unspecified: Secondary | ICD-10-CM

## 2024-02-06 DIAGNOSIS — R7989 Other specified abnormal findings of blood chemistry: Secondary | ICD-10-CM

## 2024-02-06 DIAGNOSIS — M461 Sacroiliitis, not elsewhere classified: Secondary | ICD-10-CM

## 2024-02-06 DIAGNOSIS — Z8719 Personal history of other diseases of the digestive system: Secondary | ICD-10-CM

## 2024-02-06 DIAGNOSIS — K529 Noninfective gastroenteritis and colitis, unspecified: Secondary | ICD-10-CM

## 2024-02-06 DIAGNOSIS — M7652 Patellar tendinitis, left knee: Secondary | ICD-10-CM

## 2024-02-06 NOTE — Patient Instructions (Addendum)
 Standing Labs We placed an order today for your standing lab work.   Please have your standing labs drawn in mid-July and every 3 months   Please have your labs drawn 2 weeks prior to your appointment so that the provider can discuss your lab results at your appointment, if possible.  Please note that you may see your imaging and lab results in MyChart before we have reviewed them. We will contact you once all results are reviewed. Please allow our office up to 72 hours to thoroughly review all of the results before contacting the office for clarification of your results.  WALK-IN LAB HOURS  Monday through Thursday from 8:00 am -12:30 pm and 1:00 pm-4:00 pm and Friday from 8:00 am-12:00 pm.  Patients with office visits requiring labs will be seen before walk-in labs.  You may encounter longer than normal wait times. Please allow additional time. Wait times may be shorter on  Monday and Thursday afternoons.  We do not book appointments for walk-in labs. We appreciate your patience and understanding with our staff.   Labs are drawn by [facility]. Please bring your co-pay at the time of your lab draw.  You may receive a bill from [facility] for your lab work.  Please note if you are on Hydroxychloroquine and and an order has been placed for a Hydroxychloroquine level,  you will need to have it drawn 4 hours or more after your last dose.  If you wish to have your labs drawn at another location, please call the office 24 hours in advance so we can fax the orders.  The office is located at [address], [address], [place], [place] [address]   If you have any questions regarding directions or hours of operation,  please call [phone].   As a reminder, please drink plenty of water prior to coming for your lab work. Thanks!   Exercises for Plantar Fasciitis Foot and leg exercises can help if you have plantar fasciitis. Only do the exercises you were told to do. Make sure you know how to do the  exercises safely. Follow the steps below. It's normal to feel mild discomfort. Stop if you feel pain or your pain gets worse. Do not start these exercises until told by your health care provider. Stretching and range-of-motion exercises These exercises warm up your muscles and joints. They also help with movement and flexibility of your foot. They can help with pain. Plantar fascia stretch This exercise will stretch your plantar fascia, which is a band of thick tissue on the bottom of your foot. Sit with your left / right leg crossed over your other knee. Hold your heel with one hand with that thumb near your arch. With your other hand, hold your toes. Gently pull your toes back toward the top of your foot. You should feel a stretch on the bottom of your toes, on the bottom of your foot, or both. Hold this stretch for __________ seconds. Slowly let go of your toes. Go back to the starting position. Repeat __________ times. Do this exercise __________ times a day. Gastroc stretch, standing This exercise is called an upper calf, or gastroc, stretch. It stretches the muscles in the back of your upper calf. Stand with your hands against a wall. Extend your left / right leg behind you. Bend your front knee just a little. Keep your heels on the floor, your toes facing forward, and your back knee straight. Shift your weight toward the wall. Do not  arch your back. You should feel a gentle stretch in your upper calf. Hold this position for __________ seconds. Repeat __________ times. Do this exercise __________ times a day. Soleus stretch, standing This exercise is called a lower calf, or soleus, stretch. It stretches the muscles in the back of your lower calf. Stand with your hands against a wall. Extend your left / right leg behind you, and bend your front knee slightly. Keep your heels on the floor and your toes facing forward. Bend your back knee and shift your weight slightly over your back leg. You  should feel a gentle stretch deep in your lower calf. Hold this position for __________ seconds. Repeat __________ times. Do this exercise __________ times a day. Gastroc and soleus stretch, standing step This exercise stretches the muscles in the back of your lower leg. This includes your gastroc and soleus muscles. Stand with the ball of your left / right foot on the front of a step. The ball of your foot is on the walking surface, right under your toes. Keep your other foot firmly on the same step. Hold on to the wall or a railing for balance. Slowly lift your other foot, letting your body weight press your heel down over the edge of the front of the step. Keep your knee straight and unbent. You should feel a stretch in your calf. Hold this position for __________ seconds. Return both feet to the step. Repeat this exercise with a slight bend in your left / right knee. Repeat __________ times with your left / right knee straight and __________ times with your left / right knee bent. Do this exercise __________ times a day. Balance exercise This exercise builds your balance and strength control of your arch. It helps take pressure off your plantar fascia. Single leg stand If this exercise is too easy, you can try it with your eyes closed or while standing on a pillow. Without shoes, stand near a railing or in a doorway. You may hold on to the railing or doorway as needed. Stand on your left / right foot. Keep your big toe down on the floor. Lift the arch of your foot. You should feel a stretch across the bottom of your foot and arch. Do not let your foot roll inward. Hold this position for __________ seconds. Repeat __________ times. Do this exercise __________ times a day. This information is not intended to replace advice given to you by your health care provider. Make sure you discuss any questions you have with your health care provider. Document Revised: 01/21/2023 Document Reviewed:  01/21/2023 Elsevier Patient Education  2024 [employer].

## 2024-02-20 ENCOUNTER — Other Ambulatory Visit: Payer: Self-pay | Admitting: Rheumatology

## 2024-02-20 ENCOUNTER — Other Ambulatory Visit: Payer: Self-pay

## 2024-02-20 DIAGNOSIS — H209 Unspecified iridocyclitis: Secondary | ICD-10-CM

## 2024-02-20 DIAGNOSIS — M47819 Spondylosis without myelopathy or radiculopathy, site unspecified: Secondary | ICD-10-CM

## 2024-02-20 MED ORDER — HUMIRA (2 PEN) 40 MG/0.4ML ~~LOC~~ AJKT
AUTO-INJECTOR | SUBCUTANEOUS | 0 refills | Status: DC
  Filled 2024-02-20 – 2024-02-28 (×2): qty 6, fill #0
  Filled 2024-03-02: qty 2, 28d supply, fill #0
  Filled 2024-03-26: qty 2, 28d supply, fill #1
  Filled 2024-04-16: qty 2, 28d supply, fill #2

## 2024-02-20 NOTE — Telephone Encounter (Signed)
 Last Fill: 01/22/2024  Labs: 12/19/2023 CBC WNL  CMP WNL   TB Gold: 12/19/2023 TB Gold Negative   Next Visit: 07/08/2024  Last Visit: 02/06/2024  [id number]   Current Dose per office note 02/06/2024: Humira  40 mg sq injections every 14 days   Okay to refill Humira ?

## 2024-02-28 ENCOUNTER — Other Ambulatory Visit: Payer: Self-pay

## 2024-02-28 ENCOUNTER — Encounter (INDEPENDENT_AMBULATORY_CARE_PROVIDER_SITE_OTHER): Payer: Self-pay

## 2024-03-02 ENCOUNTER — Other Ambulatory Visit: Payer: Self-pay

## 2024-03-02 ENCOUNTER — Other Ambulatory Visit (HOSPITAL_COMMUNITY): Payer: Self-pay

## 2024-03-02 NOTE — Progress Notes (Signed)
 Specialty Pharmacy Refill Coordination Note  Jeffery Holt is a 38 y.o. male contacted today regarding refills of specialty medication(s) Adalimumab  (Humira  (2 Pen))   Patient requested Delivery   Delivery date: 03/04/24   Verified address: [address], [place] [address]   Medication will be filled on 03/03/24.

## 2024-03-26 ENCOUNTER — Other Ambulatory Visit: Payer: Self-pay

## 2024-03-26 ENCOUNTER — Encounter (INDEPENDENT_AMBULATORY_CARE_PROVIDER_SITE_OTHER): Payer: Self-pay

## 2024-03-26 NOTE — Progress Notes (Signed)
 Specialty Pharmacy Refill Coordination Note  Jeffery Holt is a 38 y.o. male contacted today regarding refills of specialty medication(s) Adalimumab  (Humira  (2 Pen))   Patient requested (Patient-Rptd) Delivery   Delivery date: 03/31/24   Verified address: (Patient-Rptd) [address] [place], [address]   Medication will be filled on 07.28.25.

## 2024-03-30 ENCOUNTER — Other Ambulatory Visit: Payer: Self-pay

## 2024-04-10 ENCOUNTER — Encounter: Payer: Self-pay | Admitting: Rheumatology

## 2024-04-16 ENCOUNTER — Encounter (INDEPENDENT_AMBULATORY_CARE_PROVIDER_SITE_OTHER): Payer: Self-pay

## 2024-04-16 ENCOUNTER — Other Ambulatory Visit: Payer: Self-pay

## 2024-04-16 ENCOUNTER — Other Ambulatory Visit (HOSPITAL_COMMUNITY): Payer: Self-pay

## 2024-04-16 NOTE — Progress Notes (Signed)
 Specialty Pharmacy Refill Coordination Note  MyChart Questionnaire Submission  Jeffery Holt is a 38 y.o. male contacted today regarding refills of specialty medication(s) Humira.  Doses on hand: (Patient-Rptd) 0   Injection date: (Patient-Rptd) 05/01/24  Patient requested: (Patient-Rptd) Delivery   Delivery date: 04/21/24  Verified address: [address] [place]  [address]  Medication will be filled on 04/20/24.

## 2024-04-20 ENCOUNTER — Other Ambulatory Visit: Payer: Self-pay

## 2024-05-07 ENCOUNTER — Other Ambulatory Visit: Payer: Self-pay

## 2024-05-19 ENCOUNTER — Other Ambulatory Visit: Payer: Self-pay | Admitting: Rheumatology

## 2024-05-19 ENCOUNTER — Encounter (INDEPENDENT_AMBULATORY_CARE_PROVIDER_SITE_OTHER): Payer: Self-pay

## 2024-05-19 DIAGNOSIS — M47819 Spondylosis without myelopathy or radiculopathy, site unspecified: Secondary | ICD-10-CM

## 2024-05-19 DIAGNOSIS — H209 Unspecified iridocyclitis: Secondary | ICD-10-CM

## 2024-05-20 ENCOUNTER — Other Ambulatory Visit: Payer: Self-pay | Admitting: *Deleted

## 2024-05-20 ENCOUNTER — Other Ambulatory Visit: Payer: Self-pay

## 2024-05-20 DIAGNOSIS — Z79899 Other long term (current) drug therapy: Secondary | ICD-10-CM

## 2024-05-20 MED ORDER — HUMIRA (2 PEN) 40 MG/0.4ML ~~LOC~~ AJKT
AUTO-INJECTOR | SUBCUTANEOUS | 0 refills | Status: DC
  Filled 2024-05-20: qty 2, fill #0
  Filled 2024-05-22: qty 2, 28d supply, fill #0

## 2024-05-20 NOTE — Telephone Encounter (Signed)
 Last Fill: 02/20/2024  Labs: 12/19/2023 CBC WNL, CMP WNL   TB Gold: 12/19/2023 negative    Next Visit: 07/08/2024  Last Visit: 02/06/2024  DX: Spondyloarthropathy   Current Dose per office note on 02/06/2024: Humira  40 mg sq injections every 14 days.   Sent a mychart message to patient to advise he is overdue for labs (patient is active- last log in was 05/19/2024). Standing orders are in place.  Okay to refill 30 day supply of Humira ?

## 2024-05-21 ENCOUNTER — Ambulatory Visit: Payer: Self-pay | Admitting: Rheumatology

## 2024-05-21 LAB — CBC WITH DIFFERENTIAL/PLATELET
Absolute Lymphocytes: 2270 {cells}/uL (ref 850–3900)
Absolute Monocytes: 511 {cells}/uL (ref 200–950)
Basophils Absolute: 7 {cells}/uL (ref 0–200)
Basophils Relative: 0.1 %
Eosinophils Absolute: 117 {cells}/uL (ref 15–500)
Eosinophils Relative: 1.6 %
HCT: 42.8 % (ref 38.5–50.0)
Hemoglobin: 14.6 g/dL (ref 13.2–17.1)
MCH: 32.4 pg (ref 27.0–33.0)
MCHC: 34.1 g/dL (ref 32.0–36.0)
MCV: 95.1 fL (ref 80.0–100.0)
MPV: 9.5 fL (ref 7.5–12.5)
Monocytes Relative: 7 %
Neutro Abs: 4395 {cells}/uL (ref 1500–7800)
Neutrophils Relative %: 60.2 %
Platelets: 253 Thousand/uL (ref 140–400)
RBC: 4.5 Million/uL (ref 4.20–5.80)
RDW: 13 % (ref 11.0–15.0)
Total Lymphocyte: 31.1 %
WBC: 7.3 Thousand/uL (ref 3.8–10.8)

## 2024-05-21 LAB — COMPREHENSIVE METABOLIC PANEL WITH GFR
AG Ratio: 2.5 (calc) (ref 1.0–2.5)
ALT: 19 U/L (ref 9–46)
AST: 28 U/L (ref 10–40)
Albumin: 4.8 g/dL (ref 3.6–5.1)
Alkaline phosphatase (APISO): 43 U/L (ref 36–130)
BUN: 17 mg/dL (ref 7–25)
CO2: 29 mmol/L (ref 20–32)
Calcium: 9.1 mg/dL (ref 8.6–10.3)
Chloride: 105 mmol/L (ref 98–110)
Creat: 1.29 mg/dL (ref 0.60–1.29)
Globulin: 1.9 g/dL (ref 1.9–3.7)
Glucose, Bld: 79 mg/dL (ref 65–99)
Potassium: 4.4 mmol/L (ref 3.5–5.3)
Sodium: 141 mmol/L (ref 135–146)
Total Bilirubin: 0.6 mg/dL (ref 0.2–1.2)
Total Protein: 6.7 g/dL (ref 6.1–8.1)
eGFR: 72 mL/min/1.73m2 (ref >=60)

## 2024-05-22 ENCOUNTER — Other Ambulatory Visit: Payer: Self-pay

## 2024-05-22 ENCOUNTER — Other Ambulatory Visit (HOSPITAL_COMMUNITY): Payer: Self-pay

## 2024-05-22 NOTE — Progress Notes (Signed)
 Specialty Pharmacy Refill Coordination Note  Spoke with Jeffery Holt.  Jeffery Holt is a 38 y.o. male contacted today regarding refills of specialty medication(s) Humira .  Doses on hand: 1 for 05/23/24   Injection date: 06/06/24  Patient requested: [place]   Delivery date: 06/02/24  Verified address: [address] [place] [address]  Medication will be filled on 06/01/24. Patient is aware of delivery date.  *Patient had one dose misfire. Waited 2 weeks so that he would not mess up his injection schedule*

## 2024-06-01 ENCOUNTER — Other Ambulatory Visit: Payer: Self-pay

## 2024-06-01 ENCOUNTER — Other Ambulatory Visit (HOSPITAL_COMMUNITY): Payer: Self-pay

## 2024-06-15 ENCOUNTER — Other Ambulatory Visit: Payer: Self-pay | Admitting: Internal Medicine

## 2024-06-15 DIAGNOSIS — F411 Generalized anxiety disorder: Secondary | ICD-10-CM

## 2024-06-22 ENCOUNTER — Other Ambulatory Visit (HOSPITAL_COMMUNITY): Payer: Self-pay

## 2024-06-22 ENCOUNTER — Other Ambulatory Visit: Payer: Self-pay

## 2024-06-22 ENCOUNTER — Other Ambulatory Visit: Payer: Self-pay | Admitting: Physician Assistant

## 2024-06-22 ENCOUNTER — Encounter (INDEPENDENT_AMBULATORY_CARE_PROVIDER_SITE_OTHER): Payer: Self-pay

## 2024-06-22 DIAGNOSIS — H209 Unspecified iridocyclitis: Secondary | ICD-10-CM

## 2024-06-22 DIAGNOSIS — M47819 Spondylosis without myelopathy or radiculopathy, site unspecified: Secondary | ICD-10-CM

## 2024-06-22 MED ORDER — HUMIRA (2 PEN) 40 MG/0.4ML ~~LOC~~ AJKT
AUTO-INJECTOR | SUBCUTANEOUS | 2 refills | Status: DC
  Filled 2024-06-22: qty 2, 28d supply, fill #0
  Filled 2024-06-22: qty 2, fill #0
  Filled 2024-07-21: qty 2, 28d supply, fill #1
  Filled 2024-08-12 (×2): qty 2, 28d supply, fill #2

## 2024-06-22 NOTE — Telephone Encounter (Signed)
 Last Fill: 05/20/2024 (30 day supply)  Labs: 05/20/2024 WNL  TB Gold: 12/19/2023 Neg    Next Visit: 07/08/2024  Last Visit: 02/06/2024  [id number]   Current Dose per office note 02/06/2024: Humira  40 mg sq injections every 14 days.   Okay to refill Humira ?

## 2024-06-22 NOTE — Progress Notes (Signed)
 Specialty Pharmacy Refill Coordination Note  Jeffery Holt is a 38 y.o. male contacted today regarding refills of specialty medication(s) Adalimumab  (Humira  (2 Pen))   Patient requested Delivery   Delivery date: 07/01/24   Verified address: [address] [place], [place], [address]   Medication will be filled on 10.28.25.

## 2024-06-25 NOTE — Progress Notes (Signed)
 Office Visit Note  Patient: Jeffery Holt             Date of Birth: [birth year]           MRN: [id number]             PCP: Joshua Debby CROME, MD Referring: Joshua Debby CROME, MD Visit Date: 07/08/2024 Occupation: Data Unavailable  Subjective:  Medication management  History of Present Illness: Jeffery Holt is a 38 y.o. male with spondyloarthropathy and uveitis.  He returns today after his last visit in June 2025.  He continues to have recurrent plantar fasciitis in his left foot.  He states he is having a flare currently.  He has not been doing stretching exercises.  He states he quit jogging and has been swimming which has been helpful.  He is not having any discomfort in the spine.  He has not had any flares of uveitis and had not seen an ophthalmologist in a while.  He is on Humira  40 mg subcu every 14 days since November 2022.    Activities of Daily Living:  Patient reports morning stiffness for 10 minutes.   Patient Denies nocturnal pain.  Difficulty dressing/grooming: Denies Difficulty climbing stairs: Denies Difficulty getting out of chair: Denies Difficulty using hands for taps, buttons, cutlery, and/or writing: Denies  Review of Systems  Constitutional:  Negative for fatigue.  HENT:  Negative for mouth sores and mouth dryness.   Eyes:  Negative for dryness.  Respiratory:  Negative for shortness of breath.   Cardiovascular:  Negative for chest pain and palpitations.  Gastrointestinal:  Negative for blood in stool, constipation and diarrhea.  Endocrine: Negative for increased urination.  Genitourinary:  Negative for involuntary urination.  Musculoskeletal:  Positive for morning stiffness. Negative for joint pain, gait problem, joint pain, joint swelling, myalgias, muscle weakness, muscle tenderness and myalgias.  Skin:  Negative for color change, rash, hair loss and sensitivity to sunlight.  Allergic/Immunologic: Negative for susceptible to infections.  Neurological:  Negative for  dizziness and headaches.  Hematological:  Negative for swollen glands.  Psychiatric/Behavioral:  Negative for depressed mood and sleep disturbance. The patient is nervous/anxious.     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 Octavia     Family History  Problem Relation Age of Onset   Rheum arthritis Mother    Parkinson's disease Mother    Arthritis/Rheumatoid Mother    Tremor Mother    Kidney Stones Father    Arthritis Maternal Grandmother    Rheum arthritis Paternal Grandmother    Alcohol abuse Neg Hx    Cancer Neg Hx    COPD Neg Hx    Depression Neg Hx    Diabetes Neg Hx    Drug abuse Neg Hx    Early death Neg Hx    Hearing loss Neg Hx    Heart disease  Neg Hx    Hyperlipidemia Neg Hx    Hypertension Neg Hx    Kidney disease Neg Hx    Stroke Neg Hx    Colon cancer Neg Hx    Past Surgical History:  Procedure Laterality Date   COLONOSCOPY  06/12/2021   Tooth implant Left 10/14/2014   Social History   Tobacco Use   Smoking status: Former    Current packs/day: 0.00    Types: Cigarettes    Quit date: 2006    Years since quitting: 19.8    Passive exposure: Never   Smokeless tobacco: Never  Vaping Use   Vaping status: Never Used  Substance Use Topics   Alcohol use: Yes    Comment: rarely   Drug use: No   Social History   Social History Narrative   Not on file      Immunization History  Administered Date(s) Administered   Influenza Whole 06/22/2014   Influenza, Seasonal, Injecte, Preservative Fre 08/10/2013, 07/30/2023   Influenza,inj,Quad PF,6+ Mos 06/26/2016, 07/02/2017, 05/27/2018, 05/19/2019, 06/28/2020, 05/21/2022   Moderna Sars-Covid-2 Vaccination 10/30/2019, 11/27/2019   PFIZER(Purple Top)SARS-COV-2 Vaccination 07/29/2020   Tdap 02/11/2015     Objective: Vital Signs: BP 118/78   Pulse 61   Temp 98.2 F (36.8 C)   Resp 15   Ht 6' 3 (1.905 m)   Wt 232 lb 9.6 oz (105.5 kg)   BMI 29.07 kg/m    Physical Exam Vitals and nursing note reviewed.  Constitutional:      Appearance: He is well-developed.  HENT:     Head: Normocephalic and atraumatic.  Eyes:     Conjunctiva/sclera: Conjunctivae normal.     Pupils: Pupils are equal, round, and reactive to light.     Comments: Bilateral conjunctival injection was noted.  Cardiovascular:     Rate and Rhythm: Normal rate and regular rhythm.     Heart sounds: Normal heart sounds.  Pulmonary:     Effort: Pulmonary effort is normal.     Breath sounds: Normal breath sounds.  Abdominal:     General: Bowel sounds are normal.     Palpations: Abdomen is soft.  Musculoskeletal:     Cervical back: Normal range of motion and neck supple.  Skin:    General: Skin is warm and dry.     Capillary Refill: Capillary refill takes less than 2 seconds.  Neurological:     Mental Status: He is alert and oriented to person, place, and time.  Psychiatric:        Behavior: Behavior normal.      Musculoskeletal Exam: Cervical, thoracic and lumbar spine were in good range of motion.  There was no SI joint tenderness.  Shoulder joints, elbow joints, wrist joints, MCPs, PIPs and DIPs were in good range of motion with no synovitis.  Hip joints and knee joints were in good range of motion without any warmth swelling or effusion.  There was no tenderness over ankles or MTPs.   CDAI Exam: CDAI Score:  -- Patient Global: --; Provider Global: -- Swollen: --; Tender: -- Joint Exam 07/08/2024   No joint exam has been documented for this visit   There is currently no information documented on the homunculus. Go to the Rheumatology activity and complete the homunculus joint exam.  Investigation: No additional findings.  Imaging: No results found.  Recent Labs: Lab Results  Component Value Date   WBC 7.3 05/20/2024   HGB 14.6 05/20/2024   PLT 253 05/20/2024   NA  141 05/20/2024   K 4.4 05/20/2024   CL 105 05/20/2024   CO2 29 05/20/2024   GLUCOSE 79 05/20/2024   BUN 17 05/20/2024   CREATININE 1.29 05/20/2024   BILITOT 0.6 05/20/2024   ALKPHOS 54 11/17/2019   AST 28 05/20/2024   ALT 19 05/20/2024   PROT 6.7 05/20/2024   ALBUMIN 4.4 11/17/2019   CALCIUM 9.1 05/20/2024   GFRAA >89 09/22/2015   QFTBGOLDPLUS NEGATIVE 12/19/2023    Speciality Comments: Humira  started July 13, 2021  Procedures:  No procedures performed Allergies: Patient has no known allergies.   Assessment / Plan:     Visit Diagnoses: Spondyloarthropathy-patient states he has not had any spinal discomfort recently.  He denies any SI joint pain.  He has not had any flares of arthritis.  He gets frequently left plantar fasciitis.  He states he is not able to do stretching exercises on a regular basis.  He quit jogging and has been swimming which has been helpful on his joints.  He has been also rockclimbing.  He has noticed some calluses on his hands.  He denies any recent flares of uveitis.  High risk medication use - Humira  40 mg sq injections every 14 days-initiated on 07/13/21.  May 20, 2024 CBC and CMP were normal.  December 19, 2023 TB Gold negative.  He is advised to get labs every 3 months.  Information regarding immunization was placed in the AVS.  He was advised to hold Humira  if he develops an infection resume after the infection resolves.  Annual skin examination to screen for skin cancer was  advised.  Use of sunscreen and sun protection was discussed.  Uveitis - HLA-B27 positive, history of recurrent uveitis.  Conjunctival injection was noted.  He denies any symptoms.  He has not seen Dr. Gerrianne in several years.  Will refer him to Dr. Valdemar again.  HLA B27 positive  Elevated serum creatinine-creatinine is within normal limits now.  Ileitis-he denies any recent GI discomfort.  Plantar fasciitis of left foot-continues to have recurrent left plantar fasciitis.  He states he has not been doing stretching exercises.  A handout on exercises was given.  Patellar tendonitis of both knees-he states the symptoms subsided after he quit jogging.  Sacroiliitis-he good mobility in the lumbar spine and no SI joint tenderness.  History of gastroesophageal reflux (GERD)  Dyslipidemia  Seasonal allergic rhinitis due to other allergic trigger  Family history of lymphoma  Orders: No orders of the defined types were placed in this encounter.  No orders of the defined types were placed in this encounter.    Follow-Up Instructions: Return in about 5 months (around 12/06/2024) for Spondyloarthropathy, uveitis.   Maya Nash, MD  Note - This record has been created using Animal nutritionist.  Chart creation errors have been sought, but may not always  have been located. Such creation errors do not reflect on  the standard of medical care.

## 2024-07-08 ENCOUNTER — Ambulatory Visit: Attending: Rheumatology | Admitting: Rheumatology

## 2024-07-08 ENCOUNTER — Encounter: Payer: Self-pay | Admitting: Rheumatology

## 2024-07-08 VITALS — BP 118/78 | HR 61 | Temp 98.2°F | Resp 15 | Ht 75.0 in | Wt 232.6 lb

## 2024-07-08 DIAGNOSIS — R591 Generalized enlarged lymph nodes: Secondary | ICD-10-CM

## 2024-07-08 DIAGNOSIS — Z79899 Other long term (current) drug therapy: Secondary | ICD-10-CM | POA: Diagnosis not present

## 2024-07-08 DIAGNOSIS — Z1589 Genetic susceptibility to other disease: Secondary | ICD-10-CM | POA: Diagnosis not present

## 2024-07-08 DIAGNOSIS — M7652 Patellar tendinitis, left knee: Secondary | ICD-10-CM

## 2024-07-08 DIAGNOSIS — M47819 Spondylosis without myelopathy or radiculopathy, site unspecified: Secondary | ICD-10-CM

## 2024-07-08 DIAGNOSIS — R7989 Other specified abnormal findings of blood chemistry: Secondary | ICD-10-CM

## 2024-07-08 DIAGNOSIS — Z8719 Personal history of other diseases of the digestive system: Secondary | ICD-10-CM

## 2024-07-08 DIAGNOSIS — K529 Noninfective gastroenteritis and colitis, unspecified: Secondary | ICD-10-CM

## 2024-07-08 DIAGNOSIS — H209 Unspecified iridocyclitis: Secondary | ICD-10-CM

## 2024-07-08 DIAGNOSIS — E785 Hyperlipidemia, unspecified: Secondary | ICD-10-CM

## 2024-07-08 DIAGNOSIS — J3089 Other allergic rhinitis: Secondary | ICD-10-CM

## 2024-07-08 DIAGNOSIS — Z807 Family history of other malignant neoplasms of lymphoid, hematopoietic and related tissues: Secondary | ICD-10-CM

## 2024-07-08 DIAGNOSIS — M7651 Patellar tendinitis, right knee: Secondary | ICD-10-CM

## 2024-07-08 DIAGNOSIS — M722 Plantar fascial fibromatosis: Secondary | ICD-10-CM

## 2024-07-08 DIAGNOSIS — M461 Sacroiliitis, not elsewhere classified: Secondary | ICD-10-CM

## 2024-07-08 NOTE — Patient Instructions (Addendum)
 Standing Labs We placed an order today for your standing lab work.   Please have your standing labs drawn in December and every 3 months  Please have your labs drawn 2 weeks prior to your appointment so that the provider can discuss your lab results at your appointment, if possible.  Please note that you may see your imaging and lab results in MyChart before we have reviewed them. We will contact you once all results are reviewed. Please allow our office up to 72 hours to thoroughly review all of the results before contacting the office for clarification of your results.  WALK-IN LAB HOURS  Monday through Thursday from 8:00 am -12:30 pm and 1:00 pm-4:30 pm and Friday from 8:00 am-12:00 pm.  Patients with office visits requiring labs will be seen before walk-in labs.  You may encounter longer than normal wait times. Please allow additional time. Wait times may be shorter on  Monday and Thursday afternoons.  We do not book appointments for walk-in labs. We appreciate your patience and understanding with our staff.   Labs are drawn by [facility]. Please bring your co-pay at the time of your lab draw.  You may receive a bill from [facility] for your lab work.  Please note if you are on Hydroxychloroquine and and an order has been placed for a Hydroxychloroquine level,  you will need to have it drawn 4 hours or more after your last dose.  If you wish to have your labs drawn at another location, please call the office 24 hours in advance so we can fax the orders.  The office is located at [address], [address], [place], [place] [address]   If you have any questions regarding directions or hours of operation,  please call [phone].   As a reminder, please drink plenty of water prior to coming for your lab work. Thanks!   Vaccines You are taking a medication(s) that can suppress your immune system.  The following immunizations are recommended: Flu annually (high dose) RSV Covid-19  Td/Tdap  (tetanus, diphtheria, pertussis) every 10 years Pneumonia (Prevnar 15 then Pneumovax 23 at least 1 year apart.  Alternatively, can take Prevnar 20 without needing additional dose) Shingrix: 2 doses from 4 weeks to 6 months apart  Please check with your PCP to make sure you are up to date.   If you have signs or symptoms of an infection or start antibiotics: First, call your PCP for workup of your infection. Hold your medication through the infection, until you complete your antibiotics, and until symptoms resolve if you take the following: Injectable medication (Actemra, Benlysta, Cimzia, Cosentyx, Enbrel, Humira , Kevzara, Orencia, Remicade, Simponi, Stelara, Taltz, Tremfya) Methotrexate Leflunomide (Arava) Mycophenolate (Cellcept) Earma, Olumiant, or Rinvoq  Please get an annual skin examination to screen for skin cancer while you are on Humira .  Please use sunscreen and sun protection.  Exercises for Plantar Fasciitis Foot and leg exercises can help if you have plantar fasciitis. Only do the exercises you were told to do. Make sure you know how to do the exercises safely. Follow the steps below. It's normal to feel mild discomfort. Stop if you feel pain or your pain gets worse. Do not start these exercises until told by your health care provider. Stretching and range-of-motion exercises These exercises warm up your muscles and joints. They also help with movement and flexibility of your foot. They can help with pain. Plantar fascia stretch This exercise will stretch your plantar fascia, which is a  band of thick tissue on the bottom of your foot. Sit with your left / right leg crossed over your other knee. Hold your heel with one hand with that thumb near your arch. With your other hand, hold your toes. Gently pull your toes back toward the top of your foot. You should feel a stretch on the bottom of your toes, on the bottom of your foot, or both. Hold this stretch for __________  seconds. Slowly let go of your toes. Go back to the starting position. Repeat __________ times. Do this exercise __________ times a day. Gastroc stretch, standing This exercise is called an upper calf, or gastroc, stretch. It stretches the muscles in the back of your upper calf. Stand with your hands against a wall. Extend your left / right leg behind you. Bend your front knee just a little. Keep your heels on the floor, your toes facing forward, and your back knee straight. Shift your weight toward the wall. Do not arch your back. You should feel a gentle stretch in your upper calf. Hold this position for __________ seconds. Repeat __________ times. Do this exercise __________ times a day. Soleus stretch, standing This exercise is called a lower calf, or soleus, stretch. It stretches the muscles in the back of your lower calf. Stand with your hands against a wall. Extend your left / right leg behind you, and bend your front knee slightly. Keep your heels on the floor and your toes facing forward. Bend your back knee and shift your weight slightly over your back leg. You should feel a gentle stretch deep in your lower calf. Hold this position for __________ seconds. Repeat __________ times. Do this exercise __________ times a day. Gastroc and soleus stretch, standing step This exercise stretches the muscles in the back of your lower leg. This includes your gastroc and soleus muscles. Stand with the ball of your left / right foot on the front of a step. The ball of your foot is on the walking surface, right under your toes. Keep your other foot firmly on the same step. Hold on to the wall or a railing for balance. Slowly lift your other foot, letting your body weight press your heel down over the edge of the front of the step. Keep your knee straight and unbent. You should feel a stretch in your calf. Hold this position for __________ seconds. Return both feet to the step. Repeat this  exercise with a slight bend in your left / right knee. Repeat __________ times with your left / right knee straight and __________ times with your left / right knee bent. Do this exercise __________ times a day. Balance exercise This exercise builds your balance and strength control of your arch. It helps take pressure off your plantar fascia. Single leg stand If this exercise is too easy, you can try it with your eyes closed or while standing on a pillow. Without shoes, stand near a railing or in a doorway. You may hold on to the railing or doorway as needed. Stand on your left / right foot. Keep your big toe down on the floor. Lift the arch of your foot. You should feel a stretch across the bottom of your foot and arch. Do not let your foot roll inward. Hold this position for __________ seconds. Repeat __________ times. Do this exercise __________ times a day. This information is not intended to replace advice given to you by your health care provider. Make sure you discuss any  questions you have with your health care provider. Document Revised: 01/21/2023 Document Reviewed: 01/21/2023 Elsevier Patient Education  2024 [employer].

## 2024-07-21 ENCOUNTER — Other Ambulatory Visit (HOSPITAL_COMMUNITY): Payer: Self-pay

## 2024-07-23 ENCOUNTER — Other Ambulatory Visit: Payer: Self-pay

## 2024-07-23 NOTE — Progress Notes (Signed)
 Specialty Pharmacy Refill Coordination Note  Jeffery Holt is a 38 y.o. male contacted today regarding refills of specialty medication(s) Adalimumab  (Humira  (2 Pen))   Patient requested (Patient-Rptd) Delivery   Delivery date: 07/24/24   Verified address: (Patient-Rptd) [address] [place] [address]   Medication will be filled on: 07/23/24

## 2024-07-28 NOTE — Progress Notes (Signed)
 [facility] - Clinic Note  08/10/2024     CHIEF COMPLAINT Patient presents for Retina Evaluation    HISTORY OF PRESENT ILLNESS: Jeffery Holt is a 38 y.o. male who presents to the clinic today for:   HPI     Retina Evaluation   In both eyes.  Associated Symptoms Glare.        Comments   Patient here for Retina Evaluation. Referred by Dr Dolphus.  Patient states vision is alright. Can tell it has gotten worse. Especially at night. Hard looking at headlights. Dr saw red. Has flare up of uveitis. Not using drops.       Last edited by Orval Asberry RAMAN, COA on 08/10/2024  1:25 PM.       Referring physician: Joshua Debby CROME, MD [address] 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 (Ophthalmic Drugs)   No current facility-administered medications for this visit. (Ophthalmic Drugs)   Current Outpatient Medications (Other)  Medication Sig   adalimumab  (HUMIRA , 2 PEN,) 40 MG/0.4ML pen INJECT 1 PEN SUBCUTANEOUSLY EVERY 2 WEEKS   escitalopram  (LEXAPRO ) 10 MG tablet TAKE 1 TABLET(10 MG) BY MOUTH DAILY   No current facility-administered medications for this visit. (Other)   REVIEW OF SYSTEMS: ROS   Positive for: Gastrointestinal, Eyes, Psychiatric Negative for: Constitutional, Neurological, Skin, Genitourinary, Musculoskeletal, HENT, Endocrine, Cardiovascular, Respiratory, Allergic/Imm, Heme/Lymph Last edited by Orval Asberry RAMAN, COA on 08/10/2024  1:25 PM.      ALLERGIES No Known Allergies  PAST MEDICAL HISTORY Past Medical History:  Diagnosis Date   Allergy    GERD (gastroesophageal reflux disease)    Petit mal (HCC)    as an infant    Uveitis    per patient, dx by Dr. Octavia    Past Surgical History:  Procedure Laterality Date    COLONOSCOPY  06/12/2021   Tooth implant Left 10/14/2014    FAMILY HISTORY Family History  Problem Relation Age of Onset   Rheum arthritis Mother    Parkinson's disease Mother    Arthritis/Rheumatoid Mother    Tremor Mother    Kidney Stones Father    Arthritis Maternal Grandmother    Rheum arthritis Paternal Grandmother    Alcohol abuse Neg Hx    Cancer Neg Hx    COPD Neg Hx    Depression Neg Hx    Diabetes Neg Hx    Drug abuse Neg Hx    Early death Neg Hx    Hearing loss Neg Hx    Heart disease Neg Hx    Hyperlipidemia Neg Hx    Hypertension Neg Hx    Kidney disease Neg Hx    Stroke Neg Hx    Colon cancer Neg Hx     SOCIAL HISTORY Social History   Tobacco Use   Smoking status: Former    Current packs/day: 0.00    Types: Cigarettes    Quit date: 2006    Years since quitting: 19.9    Passive exposure: Never   Smokeless tobacco: Never  Vaping Use   Vaping status: Never Used  Substance Use Topics   Alcohol use: Yes    Comment: rarely   Drug use: No       OPHTHALMIC EXAM:  Base Eye Exam     Visual  Acuity (Snellen - Linear)       Right Left   Dist cc 20/20 -2 20/20    Correction: Glasses         Tonometry (Tonopen, 1:22 PM)       Right Left   Pressure 16 15         Pupils       Dark Light Shape React APD   Right 4 3 Round Brisk None   Left 4 3 Round Brisk None         Visual Fields (Counting fingers)       Left Right    Full Full         Extraocular Movement       Right Left    Full, Ortho Full, Ortho         Neuro/Psych     Oriented x3: Yes   Mood/Affect: Normal         Dilation     Both eyes: 1.0% Mydriacyl, 2.5% Phenylephrine @ 1:21 PM           Slit Lamp and Fundus Exam     External Exam       Right Left   External Normal Normal         Slit Lamp Exam       Right Left   Lids/Lashes Mild Meibomian gland dysfunction Normal   Conjunctiva/Sclera Trace Injection White and quiet   Cornea Clear, no  KP Clear   Anterior Chamber Deep and quiet Deep and quiet   Iris Round and dilated Round, dilated   Lens Clear Clear   Anterior Vitreous clear clear         Fundus Exam       Right Left   Disc Pink and Sharp, Compact Pink and Sharp, Compact   C/D Ratio 0.1 0.2   Macula Flat, Good foveal reflex, No heme or edema Flat, Good foveal reflex, No heme or edema   Vessels attenuated attenuated   Periphery Attached    Attached, focal pigment clumping superior to disc             Refraction     Wearing Rx       Sphere Cylinder Axis   Right -9.50 +1.50 173   Left -8.50 +2.50 170            IMAGING AND PROCEDURES  Imaging and Procedures for 08/10/2024  OCT, Retina - OU - Both Eyes       Right Eye Quality was good. Central Foveal Thickness: 307. Progression has no prior data. Findings include normal foveal contour, no IRF, no SRF, myopic contour, vitreomacular adhesion .   Left Eye Quality was good. Central Foveal Thickness: 300. Progression has no prior data. Findings include normal foveal contour, no IRF, no SRF, myopic contour, vitreomacular adhesion .   Notes *Images captured and stored on drive  Diagnosis / Impression:  NFP; no IRF/SRF OU Myopic contour OU VMA OU  Clinical management:  See below  Abbreviations: NFP - Normal foveal profile. CME - cystoid macular edema. PED - pigment epithelial detachment. IRF - intraretinal fluid. SRF - subretinal fluid. EZ - ellipsoid zone. ERM - epiretinal membrane. ORA - outer retinal atrophy. ORT - outer retinal tubulation. SRHM - subretinal hyper-reflective material. IRHM - intraretinal hyper-reflective material             ASSESSMENT/PLAN:    ICD-10-CM   1. Retinal edema of both eyes  H35.81 OCT, Retina -  OU - Both Eyes      1. 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 Octavia who saw Dr. Maree in Jan-Feb 2020  - HLA B27 positivity on 1.8.2020  - referred by Dr. Dolphus - has had flare  ups over the past year, feels its controlled with Humira , diet controlled and waiting it out  - has not used PF in several months  - pt reports OD usually more affected  - exam today without AC cell or flare OU  - monitor  - Patient is taking Humira  injected 2 times a month - pt can f/u here prn -- pt in agreement -- will call if any uveitis flares  2. No retinal edema on exam or OCT   Ophthalmic Meds Ordered this visit:  No orders of the defined types were placed in this encounter.    No follow-ups on file.  There are no Patient Instructions on file for this visit.   Explained the diagnoses, plan, and follow up with the patient and they expressed understanding.  Patient expressed understanding of the importance of proper follow up care.   This document serves as a record of services personally performed by Redell JUDITHANN Hans, MD, PhD. It was created on their behalf by Avelina Pereyra, COA an ophthalmic technician. The creation of this record is the provider's dictation and/or activities during the visit.   Electronically signed by: Avelina GORMAN Pereyra, COT  08/10/24  1:59 PM    Redell JUDITHANN Hans, M.D., Ph.D. Diseases & Surgery of the Retina and Vitreous [facility]   Abbreviations: M myopia (nearsighted); A astigmatism; H hyperopia (farsighted); P presbyopia; Mrx spectacle prescription;  CTL contact lenses; OD right eye; OS left eye; OU both eyes  XT exotropia; ET esotropia; PEK punctate epithelial keratitis; PEE punctate epithelial erosions; DES dry eye syndrome; MGD meibomian gland dysfunction; ATs artificial tears; PFAT's preservative free artificial tears; NSC nuclear sclerotic cataract; PSC posterior subcapsular cataract; ERM epi-retinal membrane; PVD posterior vitreous detachment; RD retinal detachment; DM diabetes mellitus; DR diabetic retinopathy; NPDR non-proliferative diabetic retinopathy; PDR proliferative diabetic retinopathy; CSME clinically significant  macular edema; DME diabetic macular edema; dbh dot blot hemorrhages; CWS cotton wool spot; POAG primary open angle glaucoma; C/D cup-to-disc ratio; HVF humphrey visual field; GVF goldmann visual field; OCT optical coherence tomography; IOP intraocular pressure; BRVO Branch retinal vein occlusion; CRVO central retinal vein occlusion; CRAO central retinal artery occlusion; BRAO branch retinal artery occlusion; RT retinal tear; SB scleral buckle; PPV pars plana vitrectomy; VH Vitreous hemorrhage; PRP panretinal laser photocoagulation; IVK intravitreal kenalog; VMT vitreomacular traction; MH Macular hole;  NVD neovascularization of the disc; NVE neovascularization elsewhere; AREDS age related eye disease study; ARMD age related macular degeneration; POAG primary open angle glaucoma; EBMD epithelial/anterior basement membrane dystrophy; ACIOL anterior chamber intraocular lens; IOL intraocular lens; PCIOL posterior chamber intraocular lens; Phaco/IOL phacoemulsification with intraocular lens placement; PRK photorefractive keratectomy; LASIK laser assisted in situ keratomileusis; HTN hypertension; DM diabetes mellitus; COPD chronic obstructive pulmonary disease

## 2024-08-10 ENCOUNTER — Ambulatory Visit (INDEPENDENT_AMBULATORY_CARE_PROVIDER_SITE_OTHER): Admitting: Ophthalmology

## 2024-08-10 ENCOUNTER — Encounter (INDEPENDENT_AMBULATORY_CARE_PROVIDER_SITE_OTHER): Payer: Self-pay | Admitting: Ophthalmology

## 2024-08-10 DIAGNOSIS — Z1589 Genetic susceptibility to other disease: Secondary | ICD-10-CM

## 2024-08-10 DIAGNOSIS — H209 Unspecified iridocyclitis: Secondary | ICD-10-CM

## 2024-08-10 DIAGNOSIS — H3581 Retinal edema: Secondary | ICD-10-CM

## 2024-08-11 ENCOUNTER — Encounter (INDEPENDENT_AMBULATORY_CARE_PROVIDER_SITE_OTHER): Payer: Self-pay | Admitting: Ophthalmology

## 2024-08-12 ENCOUNTER — Other Ambulatory Visit: Payer: Self-pay | Admitting: Pharmacy Technician

## 2024-08-12 ENCOUNTER — Other Ambulatory Visit: Payer: Self-pay

## 2024-08-12 NOTE — Progress Notes (Signed)
 Specialty Pharmacy Refill Coordination Note  Login Jeffery Holt is a 38 y.o. male contacted today regarding refills of specialty medication(s) Adalimumab  (Humira  (2 Pen))   Patient requested (Patient-Rptd) Delivery   Delivery date: 08/18/2024   Verified address: (Patient-Rptd) [address] [place] [address]   Medication will be filled on: 08/17/2024

## 2024-08-13 ENCOUNTER — Other Ambulatory Visit: Payer: Self-pay

## 2024-08-13 ENCOUNTER — Other Ambulatory Visit: Payer: Self-pay | Admitting: Pharmacist

## 2024-08-13 NOTE — Progress Notes (Signed)
 Specialty Pharmacy Ongoing Clinical Assessment Note  Jeffery Holt is a 38 y.o. male who is being followed by the specialty pharmacy service for RxSp Ankylosing Spondylitis   Patient's specialty medication(s) reviewed today: Adalimumab  (Humira  (2 Pen))   Missed doses in the last 4 weeks: 0   Patient/Caregiver did not have any additional questions or concerns.   Therapeutic benefit summary: Patient is achieving benefit   Adverse events/side effects summary: No adverse events/side effects   Patient's therapy is appropriate to: Continue    Goals Addressed             This Visit's Progress    Maintain optimal adherence to therapy   On track    Patient is on track. Patient will maintain adherence and adhere to provider and/or lab appointments         Follow up: 12 months  [name] Specialty Pharmacist

## 2024-08-17 ENCOUNTER — Other Ambulatory Visit: Payer: Self-pay

## 2024-09-07 ENCOUNTER — Other Ambulatory Visit: Payer: Self-pay

## 2024-09-07 ENCOUNTER — Other Ambulatory Visit: Payer: Self-pay | Admitting: Physician Assistant

## 2024-09-07 DIAGNOSIS — M47819 Spondylosis without myelopathy or radiculopathy, site unspecified: Secondary | ICD-10-CM

## 2024-09-07 DIAGNOSIS — H209 Unspecified iridocyclitis: Secondary | ICD-10-CM

## 2024-09-07 MED ORDER — HUMIRA (2 PEN) 40 MG/0.4ML ~~LOC~~ AJKT
AUTO-INJECTOR | SUBCUTANEOUS | 0 refills | Status: AC
  Filled 2024-09-07: qty 0.8, fill #0
  Filled 2024-09-08 – 2024-09-14 (×2): qty 0.8, 28d supply, fill #0

## 2024-09-07 NOTE — Telephone Encounter (Signed)
 Last Fill: 06/22/2024  Labs: 05/20/2024 normal  TB Gold: 12/19/2023 TB gold negative   Next Visit: 12/08/2024  Last Visit: 07/08/2024  IK:[id number]   Current Dose per office note 07/08/2024: Humira  40 mg sq injections every 14 days   Contacted the patient and advised he is due to update labs. Patient states he will come by this week. CBC and CMP already in order review.  Okay to refill Humira ?

## 2024-09-08 ENCOUNTER — Other Ambulatory Visit: Payer: Self-pay

## 2024-09-14 ENCOUNTER — Other Ambulatory Visit (HOSPITAL_COMMUNITY): Payer: Self-pay

## 2024-09-14 ENCOUNTER — Other Ambulatory Visit: Payer: Self-pay

## 2024-09-14 NOTE — Progress Notes (Signed)
 Specialty Pharmacy Refill Coordination Note  MyChart Questionnaire Submission  Jeffery Holt is a 39 y.o. male contacted today regarding refills of specialty medication(s) Humira   Doses on hand: 1 for 09/20/24   Next inj: 10/04/24  Patient requested: (Patient-Rptd) Delivery   Delivery date: 09/29/24  Verified address: [address] [place] [address]  Medication will be filled on 09/28/24

## 2024-09-15 ENCOUNTER — Other Ambulatory Visit: Payer: Self-pay | Admitting: *Deleted

## 2024-09-15 DIAGNOSIS — Z79899 Other long term (current) drug therapy: Secondary | ICD-10-CM

## 2024-09-15 LAB — COMPREHENSIVE METABOLIC PANEL WITH GFR
AG Ratio: 2 (calc) (ref 1.0–2.5)
ALT: 19 U/L (ref 9–46)
AST: 23 U/L (ref 10–40)
Albumin: 4.7 g/dL (ref 3.6–5.1)
Alkaline phosphatase (APISO): 49 U/L (ref 36–130)
BUN/Creatinine Ratio: 13 (calc) (ref 6–22)
BUN: 17 mg/dL (ref 7–25)
CO2: 29 mmol/L (ref 20–32)
Calcium: 9.3 mg/dL (ref 8.6–10.3)
Chloride: 104 mmol/L (ref 98–110)
Creat: 1.34 mg/dL — ABNORMAL HIGH (ref 0.60–1.29)
Globulin: 2.3 g/dL (ref 1.9–3.7)
Glucose, Bld: 98 mg/dL (ref 65–99)
Potassium: 4.5 mmol/L (ref 3.5–5.3)
Sodium: 141 mmol/L (ref 135–146)
Total Bilirubin: 0.5 mg/dL (ref 0.2–1.2)
Total Protein: 7 g/dL (ref 6.1–8.1)
eGFR: 69 mL/min/1.73m2

## 2024-09-15 LAB — CBC WITH DIFFERENTIAL/PLATELET
Absolute Lymphocytes: 2884 {cells}/uL (ref 850–3900)
Absolute Monocytes: 521 {cells}/uL (ref 200–950)
Basophils Absolute: 24 {cells}/uL (ref 0–200)
Basophils Relative: 0.3 %
Eosinophils Absolute: 190 {cells}/uL (ref 15–500)
Eosinophils Relative: 2.4 %
HCT: 45.3 % (ref 39.4–51.1)
Hemoglobin: 15.4 g/dL (ref 13.2–17.1)
MCH: 32.3 pg (ref 27.0–33.0)
MCHC: 34 g/dL (ref 31.6–35.4)
MCV: 95 fL (ref 81.4–101.7)
MPV: 9.8 fL (ref 7.5–12.5)
Monocytes Relative: 6.6 %
Neutro Abs: 4282 {cells}/uL (ref 1500–7800)
Neutrophils Relative %: 54.2 %
Platelets: 251 Thousand/uL (ref 140–400)
RBC: 4.77 Million/uL (ref 4.20–5.80)
RDW: 12.9 % (ref 11.0–15.0)
Total Lymphocyte: 36.5 %
WBC: 7.9 Thousand/uL (ref 3.8–10.8)

## 2024-09-16 ENCOUNTER — Ambulatory Visit: Payer: Self-pay | Admitting: Rheumatology

## 2024-09-16 NOTE — Progress Notes (Signed)
 CBC is normal, creatinine is mildly elevated.  Patient should avoid all NSAIDs.  He should increase his water intake.

## 2024-09-23 ENCOUNTER — Other Ambulatory Visit: Payer: Self-pay

## 2024-09-23 NOTE — Progress Notes (Signed)
 Patient was contacted by the pharmacy regarding their specialty medication(s) Humira   to reschedule an earlier delivery date, due to impending cold weather conditions. Medication(s) will be filled 09/24/24 for a delivery by 09/25/24

## 2024-09-24 ENCOUNTER — Other Ambulatory Visit: Payer: Self-pay

## 2024-12-08 ENCOUNTER — Ambulatory Visit: Admitting: Physician Assistant
